# Patient Record
Sex: Female | Born: 1935 | Race: White | Hispanic: No | Smoking: Never smoker
Health system: Southern US, Community
[De-identification: ages and names within clinical notes are randomized; demographics above are authoritative.]

## PROBLEM LIST (undated history)

## (undated) DIAGNOSIS — F0390 Unspecified dementia without behavioral disturbance: Secondary | ICD-10-CM

## (undated) DIAGNOSIS — F329 Major depressive disorder, single episode, unspecified: Secondary | ICD-10-CM

## (undated) DIAGNOSIS — G4730 Sleep apnea, unspecified: Secondary | ICD-10-CM

## (undated) DIAGNOSIS — R41841 Cognitive communication deficit: Secondary | ICD-10-CM

## (undated) DIAGNOSIS — E785 Hyperlipidemia, unspecified: Secondary | ICD-10-CM

## (undated) DIAGNOSIS — N390 Urinary tract infection, site not specified: Secondary | ICD-10-CM

## (undated) DIAGNOSIS — D649 Anemia, unspecified: Secondary | ICD-10-CM

---

## 2006-10-21 ENCOUNTER — Encounter: Admission: RE | Admit: 2006-10-21 | Discharge: 2006-10-21 | Payer: Self-pay | Admitting: Hematology and Oncology

## 2012-03-13 ENCOUNTER — Encounter: Payer: Medicare Other | Admitting: Hematology and Oncology

## 2012-03-13 DIAGNOSIS — M810 Age-related osteoporosis without current pathological fracture: Secondary | ICD-10-CM

## 2012-03-13 DIAGNOSIS — C569 Malignant neoplasm of unspecified ovary: Secondary | ICD-10-CM

## 2020-08-04 ENCOUNTER — Encounter (HOSPITAL_COMMUNITY): Payer: Self-pay

## 2020-08-04 ENCOUNTER — Other Ambulatory Visit: Payer: Self-pay

## 2020-08-04 ENCOUNTER — Emergency Department (HOSPITAL_COMMUNITY): Payer: Medicare Other

## 2020-08-04 ENCOUNTER — Emergency Department (HOSPITAL_COMMUNITY)
Admission: EM | Admit: 2020-08-04 | Discharge: 2020-08-05 | Disposition: A | Discharge status: Home / Self Care | Payer: Medicare Other | Attending: Emergency Medicine | Admitting: Emergency Medicine

## 2020-08-04 DIAGNOSIS — R4182 Altered mental status, unspecified: Secondary | ICD-10-CM | POA: Diagnosis present

## 2020-08-04 DIAGNOSIS — F0150 Vascular dementia without behavioral disturbance: Secondary | ICD-10-CM

## 2020-08-04 DIAGNOSIS — N390 Urinary tract infection, site not specified: Secondary | ICD-10-CM | POA: Diagnosis not present

## 2020-08-04 DIAGNOSIS — F0151 Vascular dementia with behavioral disturbance: Secondary | ICD-10-CM | POA: Diagnosis not present

## 2020-08-04 DIAGNOSIS — N399 Disorder of urinary system, unspecified: Secondary | ICD-10-CM | POA: Insufficient documentation

## 2020-08-04 DIAGNOSIS — Z20822 Contact with and (suspected) exposure to covid-19: Secondary | ICD-10-CM | POA: Diagnosis not present

## 2020-08-04 DIAGNOSIS — F0390 Unspecified dementia without behavioral disturbance: Secondary | ICD-10-CM | POA: Diagnosis present

## 2020-08-04 HISTORY — DX: Urinary tract infection, site not specified: N39.0

## 2020-08-04 HISTORY — DX: Unspecified dementia, unspecified severity, without behavioral disturbance, psychotic disturbance, mood disturbance, and anxiety: F03.90

## 2020-08-04 LAB — RESPIRATORY PANEL BY RT PCR (FLU A&B, COVID)
Influenza A by PCR: NEGATIVE
Influenza B by PCR: NEGATIVE
SARS Coronavirus 2 by RT PCR: NEGATIVE

## 2020-08-04 LAB — RAPID URINE DRUG SCREEN, HOSP PERFORMED
Amphetamines: NOT DETECTED
Barbiturates: NOT DETECTED
Benzodiazepines: NOT DETECTED
Cocaine: NOT DETECTED
Opiates: NOT DETECTED
Tetrahydrocannabinol: NOT DETECTED

## 2020-08-04 LAB — URINALYSIS, ROUTINE W REFLEX MICROSCOPIC
Bilirubin Urine: NEGATIVE
Glucose, UA: NEGATIVE mg/dL
Ketones, ur: 5 mg/dL — AB
Leukocytes,Ua: NEGATIVE
Nitrite: NEGATIVE
Protein, ur: NEGATIVE mg/dL
Specific Gravity, Urine: 1.018 (ref 1.005–1.030)
pH: 5 (ref 5.0–8.0)

## 2020-08-04 LAB — CBC WITH DIFFERENTIAL/PLATELET
Abs Immature Granulocytes: 0.03 10*3/uL (ref 0.00–0.07)
Basophils Absolute: 0.1 10*3/uL (ref 0.0–0.1)
Basophils Relative: 1 %
Eosinophils Absolute: 0.1 10*3/uL (ref 0.0–0.5)
Eosinophils Relative: 2 %
HCT: 40.8 % (ref 36.0–46.0)
Hemoglobin: 12.9 g/dL (ref 12.0–15.0)
Immature Granulocytes: 0 %
Lymphocytes Relative: 11 %
Lymphs Abs: 0.9 10*3/uL (ref 0.7–4.0)
MCH: 28.4 pg (ref 26.0–34.0)
MCHC: 31.6 g/dL (ref 30.0–36.0)
MCV: 89.9 fL (ref 80.0–100.0)
Monocytes Absolute: 0.6 10*3/uL (ref 0.1–1.0)
Monocytes Relative: 8 %
Neutro Abs: 6.5 10*3/uL (ref 1.7–7.7)
Neutrophils Relative %: 78 %
Platelets: 237 10*3/uL (ref 150–400)
RBC: 4.54 MIL/uL (ref 3.87–5.11)
RDW: 13.8 % (ref 11.5–15.5)
WBC: 8.3 10*3/uL (ref 4.0–10.5)
nRBC: 0 % (ref 0.0–0.2)

## 2020-08-04 LAB — COMPREHENSIVE METABOLIC PANEL
ALT: 22 U/L (ref 0–44)
AST: 26 U/L (ref 15–41)
Albumin: 3.4 g/dL — ABNORMAL LOW (ref 3.5–5.0)
Alkaline Phosphatase: 51 U/L (ref 38–126)
Anion gap: 10 (ref 5–15)
BUN: 14 mg/dL (ref 8–23)
CO2: 27 mmol/L (ref 22–32)
Calcium: 8.8 mg/dL — ABNORMAL LOW (ref 8.9–10.3)
Chloride: 105 mmol/L (ref 98–111)
Creatinine, Ser: 0.88 mg/dL (ref 0.44–1.00)
GFR, Estimated: >60 mL/min (ref >60)
Glucose, Bld: 114 mg/dL — ABNORMAL HIGH (ref 70–99)
Potassium: 3.7 mmol/L (ref 3.5–5.1)
Sodium: 142 mmol/L (ref 135–145)
Total Bilirubin: 1 mg/dL (ref 0.3–1.2)
Total Protein: 7 g/dL (ref 6.5–8.1)

## 2020-08-04 LAB — ETHANOL: Alcohol, Ethyl (B): <10 mg/dL (ref <10)

## 2020-08-04 NOTE — ED Provider Notes (Signed)
[facility] EMERGENCY DEPARTMENT Provider Note   CSN: [id number] Arrival date & time: 08/04/20  1905     History Chief Complaint  Patient presents with  . IVC    DSS case    ORIE CUTTINO is a 85 y.o. female.  Patient has been IVCed by DSS.  Patient has recently been diagnosed with dementia she refuses to get out of bed or eat or drink anything she talks to people that are not there she has made statements that she would rather be dead  The history is provided by the patient and a caregiver. No language interpreter was used.  Altered Mental Status Presenting symptoms: behavior changes   Severity:  Moderate Most recent episode:  More than 2 days ago Episode history:  Multiple Timing:  Constant Progression:  Worsening Chronicity:  New Context: dementia   Associated symptoms: no abdominal pain        Past Medical History:  Diagnosis Date  . Dementia ([place])   . UTI (urinary tract infection)     There are no problems to display for this patient.   History reviewed. No pertinent surgical history.   OB History   No obstetric history on file.     No family history on file.  Social History   Tobacco Use  . Smoking status: Never Smoker  . Smokeless tobacco: Never Used  Substance Use Topics  . Alcohol use: Never  . Drug use: Never    Home Medications Prior to Admission medications   Not on File    Allergies    Patient has no known allergies.  Review of Systems   Review of Systems  Unable to perform ROS: Mental status change  Gastrointestinal: Negative for abdominal pain.    Physical Exam Updated Vital Signs BP (!) 110/97 (BP Location: Right Arm)   Pulse 86   Temp 98.1 F (36.7 C) (Oral)   Resp 17   Ht 5\' 2"  (1.575 m)   Wt 55.3 kg   SpO2 100%   BMI 22.31 kg/m   Physical Exam Vitals and nursing note reviewed.  Constitutional:      Appearance: She is well-developed.  HENT:     Head: Normocephalic.     Nose: Nose normal.  Eyes:      General: No scleral icterus.    Conjunctiva/sclera: Conjunctivae normal.  Neck:     Thyroid: No thyromegaly.  Cardiovascular:     Rate and Rhythm: Normal rate and regular rhythm.     Heart sounds: No murmur heard.  No friction rub. No gallop.   Pulmonary:     Breath sounds: No stridor. No wheezing or rales.  Chest:     Chest wall: No tenderness.  Abdominal:     General: There is no distension.     Tenderness: There is no abdominal tenderness. There is no rebound.  Musculoskeletal:        General: Normal range of motion.     Cervical back: Neck supple.  Lymphadenopathy:     Cervical: No cervical adenopathy.  Skin:    Findings: No erythema or rash.  Neurological:     Mental Status: She is alert.     Motor: No abnormal muscle tone.     Coordination: Coordination normal.     Comments: Patient is oriented to person and place but not date or situation  Psychiatric:     Comments: Patient denies suicidal ideation now.  According to DSS on the IVC papers she has been  suicidal and hallucinating     ED Results / Procedures / Treatments   Labs (all labs ordered are listed, but only abnormal results are displayed) Labs Reviewed - No data to display  EKG None  Radiology No results found.  Procedures Procedures (including critical care time)  Medications Ordered in ED Medications - No data to display  ED Course  I have reviewed the triage vital signs and the nursing notes.  Pertinent labs & imaging results that were available during my care of the patient were reviewed by me and considered in my medical decision making (see chart for details).    MDM Rules/Calculators/A&P                          Patient with dementia who lives by herself.  Patient is delusional and only oriented to person and place.  She is not capable of caring for herself    This patient presents to the ED for concern of altered mental status, this involves an extensive number of treatment options,  and is a complaint that carries with it a high risk of complications and morbidity.  The differential diagnosis includes dementia  Lab Tests:   I Ordered, reviewed, and interpreted labs, which included CBC chemistries which were unremarkable  Medicines ordered:     Imaging Studies ordered:   I ordered imaging studies which included CT head and chest x-ray  I independently visualized and interpreted imaging which showed unremarkable  Additional history obtained:   Additional history obtained from police officer  Previous records obtained and reviewed.  Consultations Obtained:   I consulted behavioral health and discussed lab and imaging findings  Reevaluation:  After the interventions stated above, I reevaluated the patient and found unchanged  Critical Interventions:  .   Final Clinical Impression(s) / ED Diagnoses Final diagnoses:  None    Rx / DC Orders ED Discharge Orders    None       Milton Ferguson, MD 08/05/20 1159

## 2020-08-04 NOTE — ED Triage Notes (Signed)
Pt brought in by [employer] with IVC papers. Officer Karlton Lemon reports talking DSS case worker, Tyron Russell, who reports she wanted pt to be seen here because [facility] let pt leave the other night, officer reports Colletta Maryland says DSS is the primary caregiver now. Pt is fall risk and has hx dementia and UTI. Pt is alert and oriented to self and knows that she is in hospital. Pt is very pleasant in triage. Pt has no complaints.

## 2020-08-05 DIAGNOSIS — F0151 Vascular dementia with behavioral disturbance: Secondary | ICD-10-CM | POA: Diagnosis not present

## 2020-08-05 DIAGNOSIS — F0390 Unspecified dementia without behavioral disturbance: Secondary | ICD-10-CM | POA: Diagnosis present

## 2020-08-05 DIAGNOSIS — N390 Urinary tract infection, site not specified: Secondary | ICD-10-CM

## 2020-08-05 NOTE — ED Notes (Signed)
In talking to pt, pt is very alert and oriented.  Speech clear.  Is able to answer questions appropriately.  Pt informed me that she cooks and cleans for her self.  Pt admits to having a Office manager with [employer] officer, [employer] (SW) however, says she do not agree with the things the SW says and that the SW do not know her or she does not know her that well.  Pt reports having UTI about one week ago and fell, well healing bruise noted to left eye.  Denies any pain or discomfort at this time.

## 2020-08-05 NOTE — ED Notes (Signed)
TTS complete 

## 2020-08-05 NOTE — Consult Note (Signed)
Telepsych Consultation   Reason for Consult:  Possible gero-psych consult Referring Physician:  EDP: Milton Ferguson, MD Location of Patient: [facility] [phone] Location of Provider: [facility]  Patient Identification: Leslie Mcknight MRN:  [id number] Principal Diagnosis: Dementia Leslie Mcknight) Diagnosis:  Principal Problem:   Dementia ([place]) Active Problems:   UTI (urinary tract infection)   Total Time spent with patient: 30 minutes  Subjective:   Leslie Mcknight is a 85 y.o. female patient admitted via IVC for concerns of worsening dementia and confusion with concerns for safety. On assessment patient presents alert and oriented to person, place "hospital", and situation "the girl came to my home and said they were concerned about me staying by myself I guess. She talked to me like I had no choice but to go so I had to come I guess". Patient verbalized knowledge of "not knowing exact dates" and having "memory issues when it comes to time" but stated "honey I'm retired after working in a [place] for [employer] where I never missed a day and had to keep up with time all day. When you retire like I am you take life as it comes and don't worry about time so much".  Pt is well groomed and ambulating independently in her room. Pt has large bruise surrounding left eye in which she states was 2/2 to a fall in her kitchen where she was able to get herself up. 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 Pt states she still drives "but only in the middle of the day when all the people that work are at work" and mows her own grass. Pt has one son who she says travels for work and checks on her when in town. Pt stated "I'm old, I don't get out of [place]". During assessment patient remained engaged throughout answering all questions appropriately and when unable to remember any events stated "my memory is bad, I don't remember". Patient denies any suicidal or homicidal  ideations; no paranoia or delusional thoughts verbalized, denies having any auditory or visual hallucinations, and was not observed responding to any external/internal stimuli. Patient is not visibly agitated or aggressive.  Per assigned EDRN Daphyne patient slept all night without any issues. Denies any signs or symptoms of sundowning or delirium noted. Denies observing any delusions or hallucinations. Pt has been ambulating to the bathroom with standby assistance. Pt has eaten all of meals and is engaging with staff appropriately.   Collateral: [place]: [facility] Adult Protective Services: [phone] Per current APS worker, pt was IVC and placed under emergency contact order after receiving an APS report for patient being acutely confused, allegedly refusing to eat, and concern for safety and ability to continue to live alone. Worker states patient has a medical history of cancer and was last seen at [facility] office for follow-up in 2009; states patient was refusing to go to doctor. Worker states patient was recently transported for medical care where it was determined she was positive for a urinary tract infection; unsure if patient completed antibiotic course. Worker states patient was hallucinating and delusional stating patient was talking to children that weren't there. Pt was just recently diagnosed with dementia since APS involvement.  Provider spent extensive time explaining patient's current presentation and how UTI's affect elderly patients and the likelihood of the infection possibly causing the acute mental status changes. Provider explained patient was currently denying any suicidal or homicidal  ideations, no delusions or hallucinations were present, and she was not actively responding to any external/internal stimuli. Worker states patient is unable to live alone and her concerns for patient upon discharge (ADLs, safety, etc); provider discussed the plan for social work to  follow-up to explore appropriate disposition options for the patient. Provider further explained that based on patient's current presentation, she does not meet inpatient hospitalization criteria at this time.   Holley Dexter. [place]: Patient's son: [phone] Provider attempted to call patient's son 3x's this a.m., was able to speak to him briefly before phone became disconnected. Per patient's son, "she was hallucinating and talking to people that weren't there where me and my cousin came to check on her a few weeks ago. There was money and papers scattered everywhere in the house. She normally keeps the house pretty immaculate but that wasn't the case. She wouldn't eat".    HPI:  Patient is an 85 year old female who was IVC'd by Adult Materials engineer out of concern for patient's inability to care for herself after recently experiencing altered mental status 2/2 UTI. APS worker reports patient was delusional and experiencing hallucinations. 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 Patient manages her own finances and ADLs. Recent dementia diagnosis. Patient has a son who lives close by but works out of town. Unsure of the onset and duration of UTI, however patient's son reports patient was grossly affected and concerned for her safety. Patient had recent fall in home where she was able to get herself up, currently has bruising around left eye. Patient currently shows no signs of psychosis or altered mental status at this time and denies any suicidal or homicidal ideations. Patient slept throughout the night and has completed her ADLs independently with minimal assistance.   Past Psychiatric History: none noted  Risk to Self: Suicidal Ideation: No Suicidal Intent: No Is patient at risk for suicide?: No Suicidal Plan?: No Access to Means: No What has been your use of drugs/alcohol within the last 12 months?: Denies How many times?: 0 Other  Self Harm Risks: None Triggers for Past Attempts: None known Intentional Self Injurious Behavior: None Risk to Others: Homicidal Ideation: No Thoughts of Harm to Others: No Current Homicidal Intent: No Current Homicidal Plan: No Access to Homicidal Means: No Identified Victim: No one History of harm to others?: No Assessment of Violence: None Noted Violent Behavior Description: None reported Does patient have access to weapons?: No Criminal Charges Pending?: No Does patient have a court date: No Prior Inpatient Therapy: Prior Inpatient Therapy: No Prior Outpatient Therapy: Prior Outpatient Therapy: No Does patient have an ACCT team?: No Does patient have Intensive In-House Services?  : No Does patient have [facility] services? : No Does patient have P4CC services?: No  Past Medical History:  Past Medical History:  Diagnosis Date  . Dementia ([place])   . UTI (urinary tract infection)    History reviewed. No pertinent surgical history. Family History: No family history on file. Family Psychiatric  History: not noted Social History:  Social History   Substance and Sexual Activity  Alcohol Use Never     Social History   Substance and Sexual Activity  Drug Use Never    Social History   Socioeconomic History  . Marital status: Widowed    Spouse name: Not on file  . Number of children: Not on file  . Years of education: Not on file  .  Highest education level: Not on file  Occupational History  . Not on file  Tobacco Use  . Smoking status: Never Smoker  . Smokeless tobacco: Never Used  Substance and Sexual Activity  . Alcohol use: Never  . Drug use: Never  . Sexual activity: Not on file  Other Topics Concern  . Not on file  Social History Narrative  . Not on file   Social Determinants of Health   Financial Resource Strain:   . Difficulty of Paying Living Expenses: Not on file  Food Insecurity:   . Worried About Charity fundraiser in the Last Year: Not on file    . Ran Out of Food in the Last Year: Not on file  Transportation Needs:   . Lack of Transportation (Medical): Not on file  . Lack of Transportation (Non-Medical): Not on file  Physical Activity:   . Days of Exercise per Week: Not on file  . Minutes of Exercise per Session: Not on file  Stress:   . Feeling of Stress : Not on file  Social Connections:   . Frequency of Communication with Friends and Family: Not on file  . Frequency of Social Gatherings with Friends and Family: Not on file  . Attends Religious Services: Not on file  . Active Member of Clubs or Organizations: Not on file  . Attends Archivist Meetings: Not on file  . Marital Status: Not on file   Additional Social History:    Allergies:  No Known Allergies  Labs:  Results for orders placed or performed during the hospital encounter of 08/04/20 (from the past 48 hour(s))  Respiratory Panel by RT PCR (Flu A&B, Covid) - Nasopharyngeal Swab     Status: None   Collection Time: 08/04/20  8:39 PM   Specimen: Nasopharyngeal Swab  Result Value Ref Range   SARS Coronavirus 2 by RT PCR NEGATIVE NEGATIVE    Comment: (NOTE) SARS-CoV-2 target nucleic acids are NOT DETECTED.  The SARS-CoV-2 RNA is generally detectable in upper respiratoy specimens during the acute phase of infection. The lowest concentration of SARS-CoV-2 viral copies this assay can detect is 131 copies/mL. A negative result does not preclude SARS-Cov-2 infection and should not be used as the sole basis for treatment or other patient management decisions. A negative result may occur with  improper specimen collection/handling, submission of specimen other than nasopharyngeal swab, presence of viral mutation(s) within the areas targeted by this assay, and inadequate number of viral copies (<131 copies/mL). A negative result must be combined with clinical observations, patient history, and epidemiological information. The expected result is  Negative.  Fact Sheet for Patients:  PinkCheek.be  Fact Sheet for Healthcare Providers:  GravelBags.it  This test is no t yet approved or cleared by the [place] FDA and  has been authorized for detection and/or diagnosis of SARS-CoV-2 by FDA under an Emergency Use Authorization (EUA). This EUA will remain  in effect (meaning this test can be used) for the duration of the COVID-19 declaration under Section 564(b)(1) of the Act, 21 U.S.C. section 360bbb-3(b)(1), unless the authorization is terminated or revoked sooner.     Influenza A by PCR NEGATIVE NEGATIVE   Influenza B by PCR NEGATIVE NEGATIVE    Comment: (NOTE) The Xpert Xpress SARS-CoV-2/FLU/RSV assay is intended as an aid in  the diagnosis of influenza from Nasopharyngeal swab specimens and  should not be used as a sole basis for treatment. Nasal washings and  aspirates are unacceptable for  Xpert Xpress SARS-CoV-2/FLU/RSV  testing.  Fact Sheet for Patients: PinkCheek.be  Fact Sheet for Healthcare Providers: GravelBags.it  This test is not yet approved or cleared by the [place] FDA and  has been authorized for detection and/or diagnosis of SARS-CoV-2 by  FDA under an Emergency Use Authorization (EUA). This EUA will remain  in effect (meaning this test can be used) for the duration of the  Covid-19 declaration under Section 564(b)(1) of the Act, 21  U.S.C. section 360bbb-3(b)(1), unless the authorization is  terminated or revoked. Performed at [facility], [address] [place] [address], [place], [place] [address]   CBC with Differential/Platelet     Status: None   Collection Time: 08/04/20  8:49 PM  Result Value Ref Range   WBC 8.3 4.0 - 10.5 K/uL   RBC 4.54 3.87 - 5.11 MIL/uL   Hemoglobin 12.9 12.0 - 15.0 g/dL   HCT 40.8 36 - 46 %   MCV 89.9 80.0 - 100.0 fL   MCH 28.4 26.0 - 34.0 pg   MCHC 31.6 30.0 -  36.0 g/dL   RDW 13.8 11.5 - 15.5 %   Platelets 237 150 - 400 K/uL   nRBC 0.0 0.0 - 0.2 %   Neutrophils Relative % 78 %   Neutro Abs 6.5 1.7 - 7.7 K/uL   Lymphocytes Relative 11 %   Lymphs Abs 0.9 0.7 - 4.0 K/uL   Monocytes Relative 8 %   Monocytes Absolute 0.6 0.1 - 1.0 K/uL   Eosinophils Relative 2 %   Eosinophils Absolute 0.1 0.0 - 0.5 K/uL   Basophils Relative 1 %   Basophils Absolute 0.1 0.0 - 0.1 K/uL   Immature Granulocytes 0 %   Abs Immature Granulocytes 0.03 0.00 - 0.07 K/uL    Comment: Performed at [facility], [address], [place], [place] [address]  Comprehensive metabolic panel     Status: Abnormal   Collection Time: 08/04/20  8:49 PM  Result Value Ref Range   Sodium 142 135 - 145 mmol/L   Potassium 3.7 3.5 - 5.1 mmol/L   Chloride 105 98 - 111 mmol/L   CO2 27 22 - 32 mmol/L   Glucose, Bld 114 (H) 70 - 99 mg/dL    Comment: Glucose reference range applies only to samples taken after fasting for at least 8 hours.   BUN 14 8 - 23 mg/dL   Creatinine, Ser 0.88 0.44 - 1.00 mg/dL   Calcium 8.8 (L) 8.9 - 10.3 mg/dL   Total Protein 7.0 6.5 - 8.1 g/dL   Albumin 3.4 (L) 3.5 - 5.0 g/dL   AST 26 15 - 41 U/L   ALT 22 0 - 44 U/L   Alkaline Phosphatase 51 38 - 126 U/L   Total Bilirubin 1.0 0.3 - 1.2 mg/dL   GFR, Estimated >60 >60 mL/min   Anion gap 10 5 - 15    Comment: Performed at [facility], [address] [place], [place] [address]  Ethanol     Status: None   Collection Time: 08/04/20  8:49 PM  Result Value Ref Range   Alcohol, Ethyl (B) <10 <10 mg/dL    Comment: (NOTE) Lowest detectable limit for serum alcohol is 10 mg/dL.  For medical purposes only. Performed at [facility], [address], [place], [place] [address]   Urinalysis, Routine w reflex microscopic Urine, Clean Catch     Status: Abnormal   Collection Time: 08/04/20  8:55 PM  Result Value Ref Range   Color, Urine YELLOW  YELLOW   APPearance CLEAR CLEAR   Specific Gravity, Urine 1.018 1.005 - 1.030    pH 5.0 5.0 - 8.0   Glucose, UA NEGATIVE NEGATIVE mg/dL   Hgb urine dipstick SMALL (A) NEGATIVE   Bilirubin Urine NEGATIVE NEGATIVE   Ketones, ur 5 (A) NEGATIVE mg/dL   Protein, ur NEGATIVE NEGATIVE mg/dL   Nitrite NEGATIVE NEGATIVE   Leukocytes,Ua NEGATIVE NEGATIVE   RBC / HPF 0-5 0 - 5 RBC/hpf   WBC, UA 11-20 0 - 5 WBC/hpf   Bacteria, UA RARE (A) NONE SEEN   Squamous Epithelial / LPF 0-5 0 - 5   Mucus PRESENT     Comment: Performed at [facility], [address] [place], [place] [address]  Rapid urine drug screen (hospital performed)     Status: None   Collection Time: 08/04/20  8:55 PM  Result Value Ref Range   Opiates NONE DETECTED NONE DETECTED   Cocaine NONE DETECTED NONE DETECTED   Benzodiazepines NONE DETECTED NONE DETECTED   Amphetamines NONE DETECTED NONE DETECTED   Tetrahydrocannabinol NONE DETECTED NONE DETECTED   Barbiturates NONE DETECTED NONE DETECTED    Comment: (NOTE) DRUG SCREEN FOR MEDICAL PURPOSES ONLY.  IF CONFIRMATION IS NEEDED FOR ANY PURPOSE, NOTIFY LAB WITHIN 5 DAYS.  LOWEST DETECTABLE LIMITS FOR URINE DRUG SCREEN Drug Class                     Cutoff (ng/mL) Amphetamine and metabolites    1000 Barbiturate and metabolites    200 Benzodiazepine                 774 Tricyclics and metabolites     300 Opiates and metabolites        300 Cocaine and metabolites        300 THC                            50 Performed at [facility], [address], [place], [place] [address]     Medications:  No current facility-administered medications for this encounter.   No current outpatient medications on file.    Musculoskeletal: Strength & Muscle Tone: within normal limits Gait & Station: normal Patient leans: N/A  Psychiatric Specialty Exam: Physical Exam Vitals and nursing note reviewed.  Constitutional:      Appearance: Normal appearance.  HENT:     Head: Normocephalic.  Skin:    Findings: Bruising present.       Neurological:     Mental  Status: She is alert.  Psychiatric:        Attention and Perception: Attention and perception normal.        Mood and Affect: Mood and affect normal.        Speech: Speech normal.        Behavior: Behavior normal. Behavior is cooperative.        Thought Content: Thought content normal.        Cognition and Memory: Memory is impaired.        Judgment: Judgment normal.     Comments: Patient alert and oriented to self, place "hospital", day of the week, and situation. Pt verbalized a full understanding of not knowing what month or year it was stating "when you're retired like I am, you don't really try to keep up with that kind of stuff you just do what needs to be done on a daily basis. When I worked  at the [employer] I had to keep up with getting to work on time so once I retired I said I would just take like as it comes".      Review of Systems  Blood pressure (!) 153/55, pulse (!) 53, temperature 98.1 F (36.7 C), temperature source Oral, resp. rate 18, height 5\' 2"  (1.575 m), weight 55.3 kg, SpO2 96 %.Body mass index is 22.31 kg/m.  General Appearance: Neat and Well Groomed  Eye Contact:  Good  Speech:  Normal Rate  Volume:  Normal  Mood:  Euthymic  Affect:  Appropriate and Congruent  Thought Process:  Coherent and Linear  Orientation:  Full (Time, Place, and Person)  Thought Content:  WDL and Logical  Suicidal Thoughts:  No  Homicidal Thoughts:  No  Memory:  Immediate;   Fair Recent;   Fair Remote;   Poor  Judgement:  Fair  Insight:  Fair  Psychomotor Activity:  Normal  Concentration:  Concentration: Good and Attention Span: Good  Recall:  [employer] of Knowledge:  Fair  Language:  Good  Akathisia:  No  Handed:  Right  AIMS (if indicated):     Assets:  Communication Skills Desire for Improvement Financial Resources/Insurance Housing Physical Health Resilience Social Support  ADL's:  Intact  Cognition:  Impaired,  Mild  Sleep:        Treatment Plan Summary: Daily  contact with patient to assess and evaluate symptoms and progress in treatment and Plan consult with Social Work and follow up based on disposition recommendations.  Patient has been cleared by the Psychiatry team.   Disposition: No evidence of imminent risk to self or others at present.   Patient does not meet criteria for psychiatric inpatient admission. Supportive therapy provided about ongoing stressors. Discussed crisis plan, support from social network, calling 911, coming to the Emergency Department, and calling Suicide Hotline. Patient does not qualify for inpatient psychiatric hospitalization at this time. Social Work consult placed.   This service was provided via telemedicine using a 2-way, interactive audio and video technology.  Names of all persons participating in this telemedicine service and their role in this encounter. Name: Oneida Alar Role: NP  Name: Hampton Abbot Role: Attending Physician  Name: [place] Role: [facility] Adult Protective Services  Name: Caroll Rancher Role: patient    Inda Merlin, NP 08/05/2020 9:55 AM

## 2020-08-05 NOTE — ED Provider Notes (Signed)
TTS consultation is appreciated.  Patient will be held overnight for reevaluation by psychiatry in the morning.   Leslie Fuel, MD 14/44/58 [phone]

## 2020-08-05 NOTE — BH Assessment (Signed)
Tele Assessment Note   Patient Name: Leslie Mcknight MRN: [id number] Referring Physician: Dr. Milton Ferguson Location of Patient: [facility] Location of Provider: [place]  Leslie Mcknight is an 85 y.o. female.  -Clinician reviewed note by Dr. Roderic Palau.  Patient has been IVCed by DSS.  Patient has recently been diagnosed with dementia she refuses to get out of bed or eat or drink anything she talks to people that are not there she has made statements that she would rather be dead.  Patient with dementia who lives by herself.  Patient is delusional and only oriented to person and place.  She is not capable of caring for herself.  Patient has been IVC'ed by Education officer, museum with [place] DSS.  IVC paperwork says that pt has dementia and refuses to get out of bed or eat.  Also that patient has been talking to people not present and has made statements about wanting to be dead.  Patient told this clinician [place] and how neighbors were concerned about her.  She said that she still cuts her own grass.  She said she does her ADLs and prepares her own food.  She does know who [employer]ley [employer] (DSS worker) is but has trouble recalling her name.  She says that she does not agree with Miss. [name] and feels that [name] "wants it her way or the highway."    Patient denies any SI, no plan, denies intention.  She says "you have to just deal with your problems, I think it is wrong" when talking about suicide.  Pt denies previous attempts.  Patient denies any HI.  When asked if she saw things she said she sometimes would see someone on her lawn but that was a few weeks ago.  Pt denies hearing voices.  Patient eye contact is good.  She did not know which hospital she was at.  She did not remember being at [facility] earlier in the week.  She talks about people (neighbors) coming and talking to her and how she tells them to talk with their parents if they have problems.   She repeated this about 4 tiems during interview.  Patient reports that she "goes to sleep when I feel sleepy" and that she gets up around 06:00 in the morning usually.  Patient reports "if I fix something to eat, I eat it."  Patient is not responding to internal stimuli.  Pt thought process appeared logical and coherent but repetitive.  Pt memory is poor.  She was only oriented to self.  She could not tell the date or which hospital she was at.  She did not have a clear grasp of the situation.    Pt says she has not had any past inpatient psychiatric care or outpt.  Clinician did call the number for the social worker but it was to the [place] office.  -Clinician discussed patient care with Lindon Romp, FNP who recommended overnight observation and review by psychiatry.  Clinician informed Dr. Roxanne Mins of the disposition.  Diagnosis: Dementia  Past Medical History:  Past Medical History:  Diagnosis Date  . Dementia ([place])   . UTI (urinary tract infection)     History reviewed. No pertinent surgical history.  Family History: No family history on file.  Social History:  reports that she has never smoked. She has never used smokeless tobacco. She reports that she does not drink alcohol and does not use drugs.  Additional Social  History:  Alcohol / Drug Use Pain Medications: See PTA medication list Prescriptions: See PTA medication list Over the Counter: See PTA medication list History of alcohol / drug use?: No history of alcohol / drug abuse  CIWA: CIWA-Ar BP: (!) 152/66 Pulse Rate: 61 COWS:    Allergies: No Known Allergies  Home Medications: (Not in a hospital admission)   OB/GYN Status:  No LMP recorded.  General Assessment Data [facility] of Assessment: [facility] TTS Assessment: In system Is this a Tele or Face-to-Face Assessment?: Tele Assessment Is this an Initial Assessment or a Re-assessment for this encounter?: Initial Assessment Patient Accompanied by::  N/A Language Other than English: No Living Arrangements: Other (Comment) (Pt lives by herself.) What gender do you identify as?: Female Date Telepsych consult ordered in [facility]: 08/04/20 Time Telepsych consult ordered in [facility]: 2240 Marital status: Divorced [place] name: Ray Pregnancy Status: No Living Arrangements: Alone Can pt return to current living arrangement?: Yes Admission Status: Involuntary Petitioner: Other (DSS worker) Is patient capable of signing voluntary admission?: No Referral Source: Other (DSS worker ) Insurance type: Medicare     Crisis Care Plan Living Arrangements: Alone Legal Guardian: Other: ([place]?) Name of Psychiatrist: None Name of Therapist: None  Education Status Is patient currently in school?: No Current Grade: N/A Highest grade of school patient has completed: N/A Name of school: N/A Contact person: N/A IEP information if applicable: N/A Is the patient employed, unemployed or receiving disability?: Unemployed  Risk to self with the past 6 months Suicidal Ideation: No Has patient been a risk to self within the past 6 months prior to admission? : No Suicidal Intent: No Has patient had any suicidal intent within the past 6 months prior to admission? : No Is patient at risk for suicide?: No Suicidal Plan?: No Has patient had any suicidal plan within the past 6 months prior to admission? : No Access to Means: No What has been your use of drugs/alcohol within the last 12 months?: Denies Previous Attempts/Gestures: No How many times?: 0 Other Self Harm Risks: None Triggers for Past Attempts: None known Intentional Self Injurious Behavior: None Family Suicide History: No Recent stressful life event(s): Turmoil (Comment) (Some stress with DSS worker [employer].) Persecutory voices/beliefs?: No Depression: Yes Depression Symptoms: Despondent Substance abuse history and/or treatment for substance abuse?: No Suicide  prevention information given to non-admitted patients: Not applicable  Risk to Others within the past 6 months Homicidal Ideation: No Does patient have any lifetime risk of violence toward others beyond the six months prior to admission? : No Thoughts of Harm to Others: No Current Homicidal Intent: No Current Homicidal Plan: No Access to Homicidal Means: No Identified Victim: No one History of harm to others?: No Assessment of Violence: None Noted Violent Behavior Description: None reported Does patient have access to weapons?: No Criminal Charges Pending?: No Does patient have a court date: No Is patient on probation?: No  Psychosis Hallucinations: Visual (3 weeks ago may have seen someone in her yard) Delusions: None noted  Mental Status Report Appearance/Hygiene: In hospital gown Eye Contact: Good Motor Activity: Freedom of movement Speech: Logical/coherent Level of Consciousness: Alert Mood: Pleasant Affect: Appropriate to circumstance Anxiety Level: Minimal Thought Processes: Coherent Judgement: Partial Orientation: Person Obsessive Compulsive Thoughts/Behaviors: None  Cognitive Functioning Concentration: Normal Memory: Recent Impaired, Remote Impaired Is patient IDD: No Insight: Fair Impulse Control: Good Appetite: Good Have you had any weight changes? : No Change Sleep: No Change Total Hours of Sleep:  (  Pt is unclear) Vegetative Symptoms: None  ADLScreening [facility] Assessment Services) Patient's cognitive ability adequate to safely complete daily activities?: Yes Patient able to express need for assistance with ADLs?: Yes Independently performs ADLs?: Yes (appropriate for developmental age)  Prior Inpatient Therapy Prior Inpatient Therapy: No  Prior Outpatient Therapy Prior Outpatient Therapy: No Does patient have an ACCT team?: No Does patient have Intensive In-House Services?  : No Does patient have [facility] services? : No Does patient have P4CC  services?: No  ADL Screening (condition at time of admission) Patient's cognitive ability adequate to safely complete daily activities?: Yes Is the patient deaf or have difficulty hearing?: No Does the patient have difficulty seeing, even when wearing glasses/contacts?: No Does the patient have difficulty concentrating, remembering, or making decisions?: Yes Patient able to express need for assistance with ADLs?: Yes Does the patient have difficulty dressing or bathing?: No Independently performs ADLs?: Yes (appropriate for developmental age) Does the patient have difficulty walking or climbing stairs?: No Weakness of Legs: None Weakness of Arms/Hands: None (Pt says she mows her own yard.)       Abuse/Neglect Assessment (Assessment to be complete while patient is alone) Abuse/Neglect Assessment Can Be Completed: Yes Physical Abuse: Denies Verbal Abuse: Denies Sexual Abuse: Denies Exploitation of patient/patient's resources: Denies Self-Neglect: Denies     Regulatory affairs officer (For Healthcare) Does Patient Have a Medical Advance Directive?: No Would patient like information on creating a medical advance directive?: No - Patient declined          Disposition:  Disposition Initial Assessment Completed for this Encounter: Yes Patient referred to: Other (Comment) (To be reviewed by psychiatry in AM)  This service was provided via telemedicine using a 2-way, interactive audio and video technology.  Names of all persons participating in this telemedicine service and their role in this encounter. Name: Hansika Leaming Role: patient  Name: Curlene Dolphin, M.S. LCAS QP Role: clinician  Name:  Role:   Name:  Role:     Raymondo Band 08/05/2020 2:08 AM

## 2020-08-05 NOTE — Clinical Social Work Note (Signed)
CSW has left voicemails on patient's DSS worker's, [employer], work and cell numbers requesting call back.

## 2020-08-05 NOTE — ED Notes (Signed)
Pt ambulatory to bathroom and back to room with standby assist - pt has slow steady gait.

## 2020-08-05 NOTE — Clinical Social Work Note (Addendum)
Transition of Care [facility]) - Emergency Department Mini Assessment  Patient Details  Name: Leslie Mcknight MRN: [id number] Date of Birth: 03-31-1935  [facility]) CM/SW Contact:    Sherie Don, LCSW Phone Number: 08/05/2020, 11:50 AM  Clinical Narrative: Patient is an 85 year old female who presented to the ED under IVC by patient's DSS case worker, Leslie Mcknight. CSW spoke with DSS supervisor, Leslie Mcknight. CSW reviewed labs with Leslie Mcknight. Per Leslie Mcknight, DSS is trying to coordinate the patient being discharged to the patient's son or daughter-in-law. DSS to notify CSW once a discharge plan is set up.  Addendum: 2:12pm-CSW received call from Leslie Mcknight. Per Ms. Leslie Mcknight, the patient can be picked up by her daughter-in-law, Leslie Mcknight, or her niece, Leslie Mcknight. CSW updated RN. TOC signing off.   ED Mini Assessment: What brought you to the Emergency Department? : IVC Barriers to Discharge: ED Psych evaluation, Other (comment) (Patient has active DSS case)  Barrier interventions: Spoke with APS to coordinate patient's discharge needs Means of departure: Car Interventions which prevented an admission or readmission: Other (must enter comment) (Coordinating with APS as they have custody of patient)  Patient Contact and Communications Key Contact 1: [place] with: Leslie Mcknight, Leslie Mcknight Contact Date: 08/05/20  Contact time: 1134 Contact Phone Number: [phone] Call outcome: DSS to coordinate her discharge Patient states their goals for this hospitalization and ongoing recovery are:: Discharge home CMS Medicare.gov Compare Post Acute Care list provided to:: Legal Guardian (DSS) Choice offered to / list presented to : [place] / Guardian  Admission diagnosis:  IVC    Patient Active Problem List   Diagnosis Date Noted  . Dementia ([place]) 08/05/2020  . UTI (urinary tract infection) 08/05/2020   PCP:  Merryl Hacker, No Pharmacy:    [facility] [place] [address] [place] [address] Phone: [phone] Fax: [phone]

## 2020-08-05 NOTE — ED Notes (Signed)
Pt able to fed her self without assistance.

## 2020-08-05 NOTE — Discharge Instructions (Signed)
Follow closely with your PCP in the coming week to discuss symptoms and ED presentation.

## 2020-08-05 NOTE — ED Notes (Signed)
Pt up to BR without assistance.  Gait steady.

## 2020-08-05 NOTE — ED Provider Notes (Signed)
Emergency Medicine Observation Re-evaluation Note  Leslie Mcknight is a 85 y.o. female, seen on rounds today.  Pt initially presented to the ED for complaints of IVC (DSS case) Currently, the patient is awaiting TTS evaluation.  Physical Exam  BP (!) 153/55   Pulse (!) 53   Temp 98.1 F (36.7 C) (Oral)   Resp 18   Ht 5\' 2"  (1.575 m)   Wt 55.3 kg   SpO2 96%   BMI 22.31 kg/m  Physical Exam General: Calm and cooperative.  Cardiac: Well perfused. Lungs: Even, unlabored respirations.   ED Course / MDM  EKG:    I have reviewed the labs performed to date as well as medications administered while in observation.  Recent changes in the last 24 hours include TTS evaluation and discharge home with family. IVC discontinued.  Plan    Patient cleared by TTS for d/c. Family is here to pick the patient up and take her home.   Patient is not under full IVC at this time.   Margette Fast, MD 08/08/20 1521

## 2020-08-06 LAB — URINE CULTURE: Culture: 30000 — AB

## 2020-08-10 ENCOUNTER — Encounter (HOSPITAL_COMMUNITY): Payer: Self-pay | Admitting: *Deleted

## 2020-08-10 ENCOUNTER — Emergency Department (HOSPITAL_COMMUNITY)
Admission: EM | Admit: 2020-08-10 | Discharge: 2020-08-15 | Disposition: A | Discharge status: Home / Self Care | Payer: Medicare Other | Attending: Emergency Medicine | Admitting: Emergency Medicine

## 2020-08-10 DIAGNOSIS — R2681 Unsteadiness on feet: Secondary | ICD-10-CM | POA: Insufficient documentation

## 2020-08-10 DIAGNOSIS — R45851 Suicidal ideations: Secondary | ICD-10-CM | POA: Insufficient documentation

## 2020-08-10 DIAGNOSIS — M6281 Muscle weakness (generalized): Secondary | ICD-10-CM | POA: Diagnosis not present

## 2020-08-10 DIAGNOSIS — F0390 Unspecified dementia without behavioral disturbance: Secondary | ICD-10-CM | POA: Insufficient documentation

## 2020-08-10 DIAGNOSIS — R440 Auditory hallucinations: Secondary | ICD-10-CM | POA: Insufficient documentation

## 2020-08-10 DIAGNOSIS — F0391 Unspecified dementia with behavioral disturbance: Secondary | ICD-10-CM

## 2020-08-10 DIAGNOSIS — Z79899 Other long term (current) drug therapy: Secondary | ICD-10-CM | POA: Insufficient documentation

## 2020-08-10 DIAGNOSIS — F329 Major depressive disorder, single episode, unspecified: Secondary | ICD-10-CM | POA: Diagnosis not present

## 2020-08-10 DIAGNOSIS — Z20822 Contact with and (suspected) exposure to covid-19: Secondary | ICD-10-CM | POA: Diagnosis not present

## 2020-08-10 DIAGNOSIS — Z046 Encounter for general psychiatric examination, requested by authority: Secondary | ICD-10-CM | POA: Diagnosis present

## 2020-08-10 DIAGNOSIS — F32A Depression, unspecified: Secondary | ICD-10-CM | POA: Diagnosis present

## 2020-08-10 DIAGNOSIS — R2689 Other abnormalities of gait and mobility: Secondary | ICD-10-CM | POA: Insufficient documentation

## 2020-08-10 DIAGNOSIS — N390 Urinary tract infection, site not specified: Secondary | ICD-10-CM | POA: Diagnosis not present

## 2020-08-10 LAB — CBC WITH DIFFERENTIAL/PLATELET
Abs Immature Granulocytes: 0.03 10*3/uL (ref 0.00–0.07)
Basophils Absolute: 0.1 10*3/uL (ref 0.0–0.1)
Basophils Relative: 1 %
Eosinophils Absolute: 0.1 10*3/uL (ref 0.0–0.5)
Eosinophils Relative: 1 %
HCT: 41.7 % (ref 36.0–46.0)
Hemoglobin: 13.3 g/dL (ref 12.0–15.0)
Immature Granulocytes: 1 %
Lymphocytes Relative: 16 %
Lymphs Abs: 0.9 10*3/uL (ref 0.7–4.0)
MCH: 29 pg (ref 26.0–34.0)
MCHC: 31.9 g/dL (ref 30.0–36.0)
MCV: 91 fL (ref 80.0–100.0)
Monocytes Absolute: 0.5 10*3/uL (ref 0.1–1.0)
Monocytes Relative: 9 %
Neutro Abs: 4.1 10*3/uL (ref 1.7–7.7)
Neutrophils Relative %: 72 %
Platelets: 273 10*3/uL (ref 150–400)
RBC: 4.58 MIL/uL (ref 3.87–5.11)
RDW: 13.8 % (ref 11.5–15.5)
WBC: 5.6 10*3/uL (ref 4.0–10.5)
nRBC: 0 % (ref 0.0–0.2)

## 2020-08-10 LAB — COMPREHENSIVE METABOLIC PANEL
ALT: 18 U/L (ref 0–44)
AST: 22 U/L (ref 15–41)
Albumin: 3.5 g/dL (ref 3.5–5.0)
Alkaline Phosphatase: 47 U/L (ref 38–126)
Anion gap: 16 — ABNORMAL HIGH (ref 5–15)
BUN: 27 mg/dL — ABNORMAL HIGH (ref 8–23)
CO2: 20 mmol/L — ABNORMAL LOW (ref 22–32)
Calcium: 9.2 mg/dL (ref 8.9–10.3)
Chloride: 99 mmol/L (ref 98–111)
Creatinine, Ser: 1.15 mg/dL — ABNORMAL HIGH (ref 0.44–1.00)
GFR, Estimated: 44 mL/min — ABNORMAL LOW (ref >60)
Glucose, Bld: 74 mg/dL (ref 70–99)
Potassium: 3.9 mmol/L (ref 3.5–5.1)
Sodium: 135 mmol/L (ref 135–145)
Total Bilirubin: 1.4 mg/dL — ABNORMAL HIGH (ref 0.3–1.2)
Total Protein: 7 g/dL (ref 6.5–8.1)

## 2020-08-10 LAB — RAPID URINE DRUG SCREEN, HOSP PERFORMED
Amphetamines: NOT DETECTED
Barbiturates: NOT DETECTED
Benzodiazepines: NOT DETECTED
Cocaine: NOT DETECTED
Opiates: NOT DETECTED
Tetrahydrocannabinol: NOT DETECTED

## 2020-08-10 LAB — URINALYSIS, ROUTINE W REFLEX MICROSCOPIC
Bacteria, UA: NONE SEEN
Bilirubin Urine: NEGATIVE
Glucose, UA: NEGATIVE mg/dL
Ketones, ur: 80 mg/dL — AB
Nitrite: NEGATIVE
Protein, ur: 30 mg/dL — AB
Specific Gravity, Urine: 1.019 (ref 1.005–1.030)
pH: 5 (ref 5.0–8.0)

## 2020-08-10 LAB — RESPIRATORY PANEL BY RT PCR (FLU A&B, COVID)
Influenza A by PCR: NEGATIVE
Influenza B by PCR: NEGATIVE
SARS Coronavirus 2 by RT PCR: NEGATIVE

## 2020-08-10 LAB — ETHANOL: Alcohol, Ethyl (B): <10 mg/dL (ref <10)

## 2020-08-10 MED ORDER — SODIUM CHLORIDE 0.9 % IV BOLUS
1000.0000 mL | Freq: Once | INTRAVENOUS | Status: AC
  Administered 2020-08-10: 1000 mL via INTRAVENOUS

## 2020-08-10 NOTE — ED Provider Notes (Signed)
[facility] EMERGENCY DEPARTMENT Provider Note   CSN: [id number] Arrival date & time: 08/10/20  1719     History Chief Complaint  Patient presents with  . IVC    Leslie Mcknight is a 85 y.o. female with PMHx dementia who presents to the ED today under IVC from [facility].   Per IVC paperwork: "[facility] ([facility]) has an open Adult Scientist, forensic case on Leslie Mcknight. An Emergency Protective Order [phone]) was obtained 07/22/2020 to seek medical evaluation and make all medical decisions for Leslie Mcknight due to suspected dementia/confusion. Leslie Mcknight granted the order, which is still in effect. [facility] has requested Leslie Mcknight's son and daughter in law provide 24 hour care to her in their home as Leslie Mcknight has been wandering and is a danger to herself. Leslie Mcknight, daughter in low to Leslie Mcknight reported today that Leslie Mcknight last ate a meal on 08/04/20 or 08/05/20. She ate a banana on 08/06/20 and has eaten nothing since then. Leslie Mcknight has refused all food and liquids, states that she wants to die. Leslie Mcknight states that she knows if she doesn't eat or drink, she will die and that is what she wants to happen."   Pt states that she denies SI, HI, or AVH. She reports that her family want her to do "this and that" and when they don't like what she does including mowing her yard or going for a walk they get mad at her and make her come here. She states she hasn't been very hungry lately but is willing to eat something here in the ED. She has no physical complaints today.   Additional information obtained by daughter in law who states that pt has been living with her since being discharged and she wont eat anything. She drank some water and coffee earlier today but otherwise the last time she ate was the day she was discharged. Daughter in law states that pt "wants to die" because she doesn't have her "freedom." She reports that the other night pt became combative with her  when she wouldn't let her out of the house around 11 PM and she also became angry when she wouldn't let patient walk to her house earlier today.   The history is provided by the patient and medical records.       Past Medical History:  Diagnosis Date  . Dementia ([place])   . UTI (urinary tract infection)     Patient Active Problem List   Diagnosis Date Noted  . Dementia ([place]) 08/05/2020  . UTI (urinary tract infection) 08/05/2020    History reviewed. No pertinent surgical history.   OB History   No obstetric history on file.     No family history on file.  Social History   Tobacco Use  . Smoking status: Never Smoker  . Smokeless tobacco: Never Used  Substance Use Topics  . Alcohol use: Never  . Drug use: Never    Home Medications Prior to Admission medications   Medication Sig Start Date End Date Taking? Authorizing Provider  nitrofurantoin (MACRODANTIN) 100 MG capsule Take 100 mg by mouth 2 (two) times daily. 07/22/20  Yes [provider]    Allergies    Patient has no known allergies.  Review of Systems   Review of Systems  Constitutional: Positive for appetite change.  Psychiatric/Behavioral: Negative for suicidal ideas.    Physical Exam Updated Vital Signs BP (!) 154/81 (BP Location: Right Arm)   Pulse 85  Temp 97.7 F (36.5 C) (Oral)   Resp 18   Wt 53.5 kg   SpO2 100%   BMI 21.58 kg/m   Physical Exam Vitals and nursing note reviewed.  Constitutional:      Appearance: She is not ill-appearing.  HENT:     Head: Normocephalic.     Comments: Old ecchymosis to left eye from previous fall Eyes:     Conjunctiva/sclera: Conjunctivae normal.  Cardiovascular:     Rate and Rhythm: Normal rate and regular rhythm.  Pulmonary:     Effort: Pulmonary effort is normal.     Breath sounds: Normal breath sounds. No wheezing, rhonchi or rales.  Abdominal:     Palpations: Abdomen is soft.     Tenderness: There is no abdominal tenderness. There is  no guarding or rebound.  Musculoskeletal:     Cervical back: Neck supple.  Skin:    General: Skin is warm and dry.  Neurological:     Mental Status: She is alert.     Comments: Oriented to person, place, and situation. Pt unable to recall year however when asked the month she states "I think it is 2 months from Christmas which would make it October."      ED Results / Procedures / Treatments   Labs (all labs ordered are listed, but only abnormal results are displayed) Labs Reviewed  COMPREHENSIVE METABOLIC PANEL - Abnormal; Notable for the following components:      Result Value   CO2 20 (*)    BUN 27 (*)    Creatinine, Ser 1.15 (*)    Total Bilirubin 1.4 (*)    GFR, Estimated 44 (*)    Anion gap 16 (*)    All other components within normal limits  URINALYSIS, ROUTINE W REFLEX MICROSCOPIC - Abnormal; Notable for the following components:   Hgb urine dipstick SMALL (*)    Ketones, ur 80 (*)    Protein, ur 30 (*)    Leukocytes,Ua TRACE (*)    All other components within normal limits  RESPIRATORY PANEL BY RT PCR (FLU A&B, COVID)  ETHANOL  RAPID URINE DRUG SCREEN, HOSP PERFORMED  CBC WITH DIFFERENTIAL/PLATELET    EKG None  Radiology No results found.  Procedures Procedures (including critical care time)  Medications Ordered in ED Medications  sodium chloride 0.9 % bolus 1,000 mL (1,000 mLs Intravenous New Bag/Given 08/10/20 1935)    ED Course  I have reviewed the triage vital signs and the nursing notes.  Pertinent labs & imaging results that were available during my care of the patient were reviewed by me and considered in my medical decision making (see chart for details).    MDM Rules/Calculators/A&P                          85 year old female presenting to the ED today under IVC placed by [employer] after family made them aware pt was not eating or drinking at home for the past 5 days and wishing to "die" per IVC paperwork. Pt denies any of this. She states she  has not been very hungry but has no SI, HI, or AVH. Incidentally pt was just discharged from the hospital on 10/15 after being IVC'd for similar issues. She did not meet inpatient criteria at that time and she was discharged home to live with daughter in law. It appears that pt has been trying to wander at nighttime and leave her house. Difficult to  say if she is getting appropriate care from family given her dementia and may benefit from SNF. Given she is under IVC first exam performed and labwork obtained. Pt has somewhat dry MM on exam; will evaluate dehydration with labs. Pt able to eat a fruit cup and drink coke in the ED after some persuasion. Will obtain social work consult as well as TTS.   CBC without leukocytosis. Hgb stable at 13.1.  CMP with creatinine 1.15 and BUN 27, bicarb 20, and gap of 16 consistent with dehydration. Fluids ordered.  U/A with trace leuks, 11-20 WBCs. Similar to a couple of days ago. Pt denies any urinary sx.  COVID negative. Remainder of labwork unremarkable. Pt is medically cleared at this time pending TTS eval.   Social work has requested PT eval. Have ordered.   At shift change case signed out to oncoming team who will dispo patient pending TTS eval.   This note was prepared using Dragon voice recognition software and may include unintentional dictation errors due to the inherent limitations of voice recognition software.  Final Clinical Impression(s) / ED Diagnoses Final diagnoses:  None    Rx / DC Orders ED Discharge Orders    None       Eustaquio Maize, PA-C 08/10/20 2101    Milton Ferguson, MD 08/12/20 0900

## 2020-08-10 NOTE — ED Triage Notes (Signed)
Brought in for evaluation by [facility]. Family states patient is not eating for the past few days

## 2020-08-10 NOTE — BH Assessment (Signed)
Comprehensive Clinical Assessment (CCA) Note  08/10/2020 Leslie Mcknight [id number]  Visit Diagnosis:    F32.9 Depressive Disorder    Leslie Mcknight is an 85 yo female currently in [place] ED after refusing to eat or drink.  Pt admits "I would rather be dead than to be bossed around like they are bossing me around".  Pt initially denied SI but then admits that she hasn't been eating so she would die. Pt denies any HI or AVH.  "People say that I hear voices or talk to myself, though".    Pt denies any past history of depression or anxiety.  Pt is currently living with daughter-in-law but reports that she wants to move back to her own home.  "I miss my freedoms".  Pt was poor historian and could not state her date of birth.  Pt had poor reflective capacity and impaired judgement.  Unable to obtain collateral information--called numbers on pts chart and could not leave voice mail message.  Patient's Currently Reported Symptoms/Problems: SI, depression  Jeanmarie Plant, MSW, LCSW Outpatient Therapist/Triage Specialist  Disposition: Per Lindon Romp, NP pt meets criteria for geropsychiatric inpatient treatment referral.  [facility]stine Na contacted with Dispo plan.     CCA Screening, Triage and Referral (STR)  Patient Reported Information How did you hear about [place]? Other (Comment)  Referral name: [facility]  Referral phone number: No data recorded  Whom do you see for routine medical problems? No data recorded Practice/Facility Name: No data recorded Practice/Facility Phone Number: No data recorded Name of Contact: No data recorded Contact Number: No data recorded Contact Fax Number: No data recorded Prescriber Name: No data recorded Prescriber Address (if known): No data recorded  What Is the Reason for Your Visit/Call Today? pt refusing to eat or drink because she wants to die  How Long Has This Been Causing You Problems? 1 wk - 1 month  What Do You Feel Would Help You the Most Today?  Assessment Only   Have You Recently Been in Any Inpatient Treatment (Hospital/Detox/Crisis Center/28-Day Program)? No  Name/Location of Program/Hospital:No data recorded How Long Were You There? No data recorded When Were You Discharged? No data recorded  Have You Ever Received Services From [facility] Before? Yes  Who Do You See at [facility]? No data recorded  Have You Recently Had Any Thoughts About Hurting Yourself? No  Are You Planning to Commit Suicide/Harm Yourself At This time? No   Have you Recently Had Thoughts About [place]? No  Explanation: No data recorded  Have You Used Any Alcohol or Drugs in the Past 24 Hours? No  How Long Ago Did You Use Drugs or Alcohol? No data recorded What Did You Use and How Much? No data recorded  Do You Currently Have a Therapist/Psychiatrist? No  Name of Therapist/Psychiatrist: No data recorded  Have You Been Recently Discharged From Any Office Practice or Programs? No data recorded Explanation of Discharge From Practice/Program: No data recorded    CCA Screening Triage Referral Assessment Type of Contact: Tele-Assessment  Is this Initial or Reassessment? Initial Assessment  Date Telepsych consult ordered in [facility]:  08/10/20  Time Telepsych consult ordered in [facility]:  [place]   Patient Reported Information Reviewed? Yes  Patient Left Without Being Seen? No data recorded Reason for Not Completing Assessment: No data recorded  Collateral Involvement: Attemped to reach [employer] listed on pt chart at [phone] call went straight to voicemail   Does Patient Have a Stage manager  Guardian? No data recorded Name and Contact of Legal Guardian: Billey [employer], [place] worker [phone]  If Minor and Not Living with Parent(s), Who has Custody? No data recorded Is CPS involved or ever been involved? No data recorded Is APS involved or ever been involved? Currently   Patient Determined To Be  At Risk for Harm To Self or Others Based on Review of Patient Reported Information or Presenting Complaint? Yes, for Self-Harm  Method: No data recorded Availability of Means: No data recorded Intent: No data recorded Notification Required: No data recorded Additional Information for Danger to Others Potential: No data recorded Additional Comments for Danger to Others Potential: No data recorded Are There Guns or Other Weapons in Your Home? No data recorded Types of Guns/Weapons: No data recorded Are These Weapons Safely Secured?                            No data recorded Who Could Verify You Are Able To Have These Secured: No data recorded Do You Have any Outstanding Charges, Pending Court Dates, Parole/Probation? No data recorded Contacted To Inform of Risk of Harm To Self or Others: No data recorded  Location of Assessment: [facility]   Does Patient Present under Involuntary Commitment? Yes  IVC Papers Initial File Date: No data recorded  [place] of Residence: [place]   Patient Currently Receiving the Following Services: No data recorded  Determination of Need: No data recorded  Options For Referral: Geropsychiatric Facility    CCA Biopsychosocial  Intake/Chief Complaint:  CCA Intake With Chief Complaint CCA Part Two Date: 08/10/20 CCA Part Two Time: 2215 Chief Complaint/Presenting Problem: Leslie Mcknight is an 85 yo female currently in [place] ED after refusing to eat or drink.  Pt admits "I would rather be dead than to be bossed around like they are bossing me around".  Pt initially denied SI but then admits that she hasn't been eating so she would die. Pt denies any HI or AVH.  "People say that I hear voices or talk to myself, though".  Pt denies any past history of depression or anxiety.  Pt is currently living with daughter-in-law but reports that she wants to move back to her own home.  "I miss my freedoms".  Pt was poor historian and could not state her date of birth.  Pt had  poor reflective capacity and impaired judgement. Patient's Currently Reported Symptoms/Problems: SI, depression Individual's Strengths: good family support Type of Services Patient Feels Are Needed: pt doesn't want services--wants to go home  Mental Health Symptoms Depression:  Depression: Change in energy/activity, Irritability, Increase/decrease in appetite  Mania:  Mania: N/A  Anxiety:   Anxiety: Worrying  Psychosis:  Psychosis: None (pt denies)  Trauma:  Trauma: None  Obsessions:  Obsessions: None  Compulsions:  Compulsions: None  Inattention:  Inattention: None  Hyperactivity/Impulsivity:  Hyperactivity/Impulsivity: N/A  Oppositional/Defiant Behaviors:  Oppositional/Defiant Behaviors: N/A  Emotional Irregularity:  Emotional Irregularity: None  Other Mood/Personality Symptoms:      Mental Status Exam Appearance and self-care  Stature:  Stature: Small  Weight:  Weight: Average weight  Clothing:  Clothing: Neat/clean  Grooming:  Grooming: Normal  Cosmetic use:  Cosmetic Use: None  Posture/gait:  Posture/Gait: Normal  Motor activity:  Motor Activity: Not Remarkable  Sensorium  Attention:  Attention: Confused  Concentration:  Concentration: Scattered  Orientation:  Orientation: Person, Situation, Place  Recall/memory:     Affect and Mood  Affect:  Affect: Labile  Mood:  Mood: Anxious  Relating  Eye contact:  Eye Contact: Normal  Facial expression:  Facial Expression: Tense  Attitude toward examiner:  Attitude Toward Examiner: Cooperative  Thought and Language  Speech flow: Speech Flow: Clear and Coherent  Thought content:  Thought Content: Appropriate to Mood and Circumstances  Preoccupation:  Preoccupations: None  Hallucinations:  Hallucinations: None  Organization:     Transport planner of Knowledge:  Fund of Knowledge: Good  Intelligence:  Intelligence: Average  Abstraction:  Abstraction: Normal  Judgement:  Judgement: Impaired  Reality Testing:  Reality  Testing: Distorted  Insight:  Insight: Gaps  Decision Making:  Decision Making: Impulsive  Social Functioning  Social Maturity:  Social Maturity: Impulsive  Social Judgement:  Social Judgement:  (unable to assess)  Stress  Stressors:  Stressors: Family conflict, Transitions  Coping Ability:  Coping Ability: Normal  Skill Deficits:  Skill Deficits: Activities of daily living, Self-control, Decision making, Communication, Responsibility, Self-care  Supports:  Supports: Family    Exercise/Diet: Exercise/Diet Do You Have Any Trouble Sleeping?: Yes Explanation of Sleeping Difficulties: up at night   CCA Employment/Education  Employment/Work Situation: Employment / Work Situation Employment situation: Retired Has patient ever been in the [employer]?: No   CCA Family/Childhood History  Family and Relationship History:    Childhood History:  Childhood History By whom was/is the patient raised?: Both parents Additional childhood history information: stable childhoos Description of patient's relationship with caregiver when they were a child: good relationships with parents when younger Did patient suffer any verbal/emotional/physical/sexual abuse as a child?: No Did patient suffer from severe childhood neglect?: No Has patient ever been sexually abused/assaulted/raped as an adolescent or adult?: No Was the patient ever a victim of a crime or a disaster?: No Witnessed domestic violence?: No Has patient been affected by domestic violence as an adult?: No  Child/Adolescent Assessment:     CCA Substance Use  Alcohol/Drug Use: Alcohol / Drug Use Pain Medications: See PTA medication list Prescriptions: See PTA medication list Over the Counter: See PTA medication list History of alcohol / drug use?: No history of alcohol / drug abuse     ASAM's:  Six Dimensions of Multidimensional Assessment  Dimension 1:  Acute Intoxication and/or Withdrawal Potential:   Dimension 1:   Description of individual's past and current experiences of substance use and withdrawal: pt denies  Dimension 2:  Biomedical Conditions and Complications:      Dimension 3:  Emotional, Behavioral, or Cognitive Conditions and Complications:     Dimension 4:  Readiness to Change:     Dimension 5:  Relapse, Continued use, or Continued Problem Potential:     Dimension 6:  Recovery/Living Environment:     ASAM Severity Score: ASAM's Severity Rating Score: 0  ASAM Recommended Level of Treatment:     Substance use Disorder (SUD)    Recommendations for Services/Supports/Treatments: Recommendations for Services/Supports/Treatments Recommendations For Services/Supports/Treatments: Inpatient Hospitalization  DSM5 Diagnoses: Patient Active Problem List   Diagnosis Date Noted  . Dementia ([place]) 08/05/2020  . UTI (urinary tract infection) 08/05/2020    Referrals to Alternative Service(s): Referred to Alternative Service(s):   Place:   Date:   Time:    Referred to Alternative Service(s):   Place:   Date:   Time:    Referred to Alternative Service(s):   Place:   Date:   Time:    Referred to Alternative Service(s):   Place:   Date:   Time:  [place]

## 2020-08-10 NOTE — Clinical Social Work Note (Signed)
CSW spoke with pts current DSS worker Carye [phone] ext [phone]. Per [facility] DSS has active Emergency Protective Order (EPO) for pt. Per Carye pt does not have capacity. Pt has been stating that she is not going to eat and that she wants to die. Pt is only to be picked up by her daughter-in-law, Aviyah Swetz, or her niece, Benita Stabile.

## 2020-08-10 NOTE — ED Notes (Signed)
Room was broke down things put away. Patient put on purple scrubs with no problems. Belongings were put in locker.

## 2020-08-11 DIAGNOSIS — F329 Major depressive disorder, single episode, unspecified: Secondary | ICD-10-CM | POA: Diagnosis not present

## 2020-08-11 NOTE — BH Assessment (Signed)
Reassessment note: Pt presents lying on her back in bed dressed in scrubs. Pt is alert. She reports she is feeling good but wants to go back home. Pt is concerned if she stays in the hospital bed she will lose the ability to walk. Pt states she is not sure why she is in the hospital. She shared that since not working or keeping calendars, she doesn't keep up with the days very well. Pt tenatively answered the year was 2021. She was unable to give the president's name but knew he was vice-president in the past. Pt says she likes to be outdoors & has work to do. She misses helping and talking with the people that live in one of [place] mobile home [place]. Pt states they are more like support to her than the people that have recently come around and got her in the hospital. Pt denies SI, HI and AVH. She spoke of overhearing people not being nice to each other at the mobile home park last night. Inpt tx continues to be recommended.

## 2020-08-11 NOTE — ED Provider Notes (Signed)
Emergency Medicine Observation Re-evaluation Note  Leslie Mcknight is a 85 y.o. female, seen on rounds today.  Pt initially presented to the ED for complaints of IVC Currently, the patient is awaiting placement.  Physical Exam  BP (!) 142/68   Pulse 80   Temp 98.2 F (36.8 C) (Oral)   Resp 18   Wt 53.5 kg   SpO2 100%   BMI 21.58 kg/m  Physical Exam General: No acute distress, resting Cardiac: Regular rate Lungs: Breathing easily, no retractions or stridor Psych: Calm, resting  ED Course / MDM  EKG:    I have reviewed the labs performed to date as well as medications administered while in observation.  Recent changes in the last 24 hours include no acute changes.  Urine culture ordered based on urinalysis abnormalities.  Urine culture still pending  Plan  Current plan is for inpatient gero psych.    Dorie Rank, MD 08/11/20 [phone]

## 2020-08-11 NOTE — Clinical Social Work Note (Signed)
CSW spoke with Jolene Schimke with DSS. CSW provided update that patient has been faxed out for inpatient placement and [facility] is awaiting bed offers. TOC to follow.

## 2020-08-11 NOTE — ED Notes (Signed)
Pt awake . Given paper and crayon to write a note per her request

## 2020-08-11 NOTE — Progress Notes (Signed)
Pt meets inpatient criteria per Lindon Romp, NP. Referral information has been sent to the following hospitals for review:  [facility]  [facility]     Disposition will continue to assist with inpatient placement needs.     Audree Camel, MSW, LCSW, [place] Clinical Social Worker II Disposition CSW [phone]

## 2020-08-11 NOTE — ED Notes (Signed)
Pt assisted to the restroom . Pt ambulatory

## 2020-08-11 NOTE — Evaluation (Signed)
Physical Therapy Evaluation Patient Details Name: Leslie Mcknight MRN: [id number] DOB: 12-30-1934 Today's Date: 08/11/2020   History of Present Illness  TIMOTHEA Mcknight is a 85 y.o. female with PMHx dementia who presents to the ED today under IVC from [facility].   Clinical Impression  Patient functioning at baseline for functional mobility and gait.  Plan:  Patient discharged from physical therapy to care of nursing for ambulation daily as tolerated for length of stay.     Follow Up Recommendations No PT follow up;Supervision - Intermittent    Equipment Recommendations  None recommended by PT    Recommendations for Other Services       Precautions / Restrictions Precautions Precautions: None Restrictions Weight Bearing Restrictions: No      Mobility  Bed Mobility Overal bed mobility: Modified Independent             General bed mobility comments: had to crawl back into bed secondary to gurney high for her sit on    Transfers Overall transfer level: Modified independent Equipment used: None             General transfer comment: no loss of balance  Ambulation/Gait Ambulation/Gait assistance: Modified independent (Device/Increase time) Gait Distance (Feet): 100 Feet Assistive device: None Gait Pattern/deviations: Decreased step length - right;Decreased step length - left;Decreased stride length Gait velocity: decreased   General Gait Details: slightly labored movement without loss of balance  Stairs            Wheelchair Mobility    Modified Rankin (Stroke Patients Only)       Balance Overall balance assessment: Mild deficits observed, not formally tested                                           Pertinent Vitals/Pain Pain Assessment: No/denies pain    Home Living Family/patient expects to be discharged to:: Private residence Living Arrangements: Alone Available Help at Discharge: Family;Available PRN/intermittently Type  of Home: Mobile home Home Access: Level entry     Home Layout: One level Home Equipment: None      Prior Function Level of Independence: Independent         Comments: household and short distanced community ambulator without AD     Hand Dominance        Extremity/Trunk Assessment   Upper Extremity Assessment Upper Extremity Assessment: Overall WFL for tasks assessed    Lower Extremity Assessment Lower Extremity Assessment: Overall WFL for tasks assessed    Cervical / Trunk Assessment Cervical / Trunk Assessment: Normal  Communication   Communication: No difficulties  Cognition Arousal/Alertness: Awake/alert Behavior During Therapy: WFL for tasks assessed/performed Overall Cognitive Status: Within Functional Limits for tasks assessed                                        General Comments      Exercises     Assessment/Plan    PT Assessment Patent does not need any further PT services  PT Problem List         PT Treatment Interventions      PT Goals (Current goals can be found in the Care Plan section)  Acute Rehab PT Goals Patient Stated Goal: return home with neighbors to assist PT Goal Formulation: With patient  Time For Goal Achievement: 08/11/20 Potential to Achieve Goals: Good    Frequency     Barriers to discharge        Co-evaluation               AM-PAC PT "6 Clicks" Mobility  Outcome Measure Help needed turning from your back to your side while in a flat bed without using bedrails?: None Help needed moving from lying on your back to sitting on the side of a flat bed without using bedrails?: None Help needed moving to and from a bed to a chair (including a wheelchair)?: None Help needed standing up from a chair using your arms (e.g., wheelchair or bedside chair)?: None Help needed to walk in hospital room?: A Little Help needed climbing 3-5 steps with a railing? : A Little 6 Click Score: 22    End of Session    Activity Tolerance: Patient tolerated treatment well Patient left: in bed;with call bell/phone within reach;Other (comment) (sitter observing patient) Nurse Communication: Mobility status PT Visit Diagnosis: Unsteadiness on feet (R26.81);Other abnormalities of gait and mobility (R26.89);Muscle weakness (generalized) (M62.81)    Time: 1624-4695 PT Time Calculation (min) (ACUTE ONLY): 18 min   Charges:   PT Evaluation $PT Eval Low Complexity: 1 Low PT Treatments $Therapeutic Activity: 8-22 mins        10:11 AM, 08/11/20 Lonell Grandchild, MPT Physical Therapist with [facility] [address] [phone] office [phone] mobile phone

## 2020-08-12 DIAGNOSIS — F329 Major depressive disorder, single episode, unspecified: Secondary | ICD-10-CM | POA: Diagnosis not present

## 2020-08-12 LAB — URINE CULTURE

## 2020-08-12 NOTE — ED Provider Notes (Signed)
Emergency Medicine Observation Re-evaluation Note  Leslie Mcknight is a 85 y.o. female, seen on rounds today.  Pt initially presented to the ED for complaints of IVC Currently, the patient is calm and appropriate.  Physical Exam  BP (!) 149/88 (BP Location: Left Arm)   Pulse 72   Temp 98.1 F (36.7 C) (Oral)   Resp 16   Wt 53.5 kg   SpO2 99%   BMI 21.58 kg/m  Physical Exam General: No distress Cardiac: Regular rate and rhythm Lungs: Clear to auscultation Psych: Appropriate  ED Course / MDM  EKG:EKG Interpretation  Date/Time:  Thursday August 11 2020 14:41:54 EDT Ventricular Rate:  84 PR Interval:  156 QRS Duration: 86 QT Interval:  376 QTC Calculation: 444 R Axis:   -44 Text Interpretation: Normal sinus rhythm Left axis deviation Anterior infarct , age undetermined Abnormal ECG No old tracing to compare Confirmed by Dorie Rank [phone]) on 08/11/2020 2:51:54 PM    I have reviewed the labs performed to date as well as medications administered while in observation.  Recent changes in the last 24 hours include no significant changes.  Plan  Current plan is for inpatient bed search. Patient is under full IVC at this time.   Leslie Rasmussen, MD 08/12/20 [phone]

## 2020-08-12 NOTE — ED Notes (Signed)
Pt up to bathroom and back to room with standby assist from [place], [place]

## 2020-08-12 NOTE — BH Assessment (Signed)
Re-Assessment 08/12/2020:  Clinician reviewed note by Dr. Roderic Palau.  Patient has been IVCed by DSS on 08/04/20.  "Patient has recently been diagnosed with dementia she refuses to get out of bed or eat or drink anything she talks to people that are not there she has made statements that she would rather be dead.  Patient with dementia who lives by herself.  Patient is delusional and only oriented to person and place.  She is not capable of caring for herself".  Today, patient states that she is having a great day and feels good. When asked what brought her to the Emergency Department on 08/04/20 she states that police made her come. She feels that "people" were calling the police and telling them things about her. Sts that she doesn't know exactly what was said but suspects it was her niece that made the calls. Sts that her niece is the reason why she is in the hospital. Patient becomes tearful. States, "She only see's me 4x's per year but seems know all my business". Denies current thoughts of suicide. Denies prior history of suicide attempts and/gestures. Denies depressive symptoms. States, "Anytime I started thinking of something bad I do something to keep me busy". When asked about her appetite she states, "I eat like a cow" and "I can eat anything". She reports sleeping well and getting plenty of rest. Patient denies HI.  Denies AVH's.  She does not appear to be responding to internal stimuli.  She is oriented to person and place. When asked about the year she states that it is 19. However, is aware that today is Friday. She is moderately aware of he situation leading to her IVC. She is alert and oriented.  Patient had good eye contact and was cooperative.  She was dressed in scrubs and he appeared appropriately groomed.  Patient's demeanor was tearful at times. She was observed sitting up in the hospital bed and appeared comfortable. Her mood and affect were depressed and sad.  Her speech was normal in  rate, rhythm, and volume.  Thought processes tangential with flight of ideas. There was no evidence of delusion.  Impulse control is fair. Judgment, and insight is poor.

## 2020-08-12 NOTE — Progress Notes (Signed)
Per Caryl Pina in admissions at [facility], pt is being reviewed. EKG, chest x-ray and Covid test results have been faxed to [facility] at their request.    Audree Camel, MSW, LCSW, [place] Worker II Disposition CSW [phone]

## 2020-08-12 NOTE — ED Notes (Signed)
Pt ambulated to restroom and assisted back into bed. Pt given warm blanket.
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
## 2020-08-13 DIAGNOSIS — F329 Major depressive disorder, single episode, unspecified: Secondary | ICD-10-CM | POA: Diagnosis not present

## 2020-08-13 MED ORDER — ZOLPIDEM TARTRATE 5 MG PO TABS
5.0000 mg | ORAL_TABLET | Freq: Every evening | ORAL | Status: DC | PRN
  Administered 2020-08-13 – 2020-08-14 (×3): 5 mg via ORAL
  Filled 2020-08-13 (×3): qty 1

## 2020-08-13 MED ORDER — NITROFURANTOIN MONOHYD MACRO 100 MG PO CAPS
100.0000 mg | ORAL_CAPSULE | Freq: Two times a day (BID) | ORAL | Status: DC
  Administered 2020-08-13 – 2020-08-15 (×5): 100 mg via ORAL
  Filled 2020-08-13 (×5): qty 1

## 2020-08-13 MED ORDER — HALOPERIDOL 5 MG PO TABS
2.5000 mg | ORAL_TABLET | Freq: Once | ORAL | Status: AC
  Administered 2020-08-13: 2.5 mg via ORAL
  Filled 2020-08-13: qty 1
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
## 2020-08-13 NOTE — Progress Notes (Signed)
CSW contacted [facility] admissions department and spoke with Caryl Pina to check status of this referral. She confirmed receipt of updated clinicals and is having their physician review. Caryl Pina stated that she would call back this afternoon with a decision.  Vaudie Engebretsen S. Ouida Sills, MSW, LCSW Clinical Social Worker 08/13/2020 10:18 AM

## 2020-08-13 NOTE — BH Assessment (Signed)
Patient was seen for re-assessment.  Patient was lucid at the time she was reassessed and was eating breakfast.  She was oriented and alert, attentive and cooperative.  However, at 1:58 this morning, she was delusional and talking to people who were not there.  Patient continues to be in need of geriatric psych placement.

## 2020-08-13 NOTE — ED Notes (Signed)
Pt repeatedly leaving room and trying to leave, was initially redirectable but is increasingly agitated and needing to be physically blocked from pushing through exit doors.

## 2020-08-14 DIAGNOSIS — F329 Major depressive disorder, single episode, unspecified: Secondary | ICD-10-CM | POA: Diagnosis not present

## 2020-08-14 NOTE — Progress Notes (Signed)
CSW contacted Caryl Pina from [facility] admissions to follow-up on this referral. Caryl Pina stated that pt has been denied due to her primary diagnosis being dementia. CSW re-faxed pt out to the following gero-psych facilities in an effort to secure placement:  [facility]  TTS continuing to assess.    Erubiel Manasco S. Ouida Sills, MSW, LCSW Clinical Social Worker 08/14/2020 8:09 AM

## 2020-08-14 NOTE — ED Notes (Signed)
Pt sitting up at bedside table folding linen

## 2020-08-14 NOTE — ED Notes (Signed)
Family visiting with patient

## 2020-08-14 NOTE — BH Assessment (Signed)
Patient was seen for reassessment.  She was disoriented to time, day, place and situation.  She was alert and pleasant as well as cooperative.  Patient states that she has been eating and sleeping well.  Patient denies SI/HI/Psychosis.  Patient's primary diagnosis is Dementia and she has been refused placement at [facility] units because of this diagnosis and the fact that other than behavior management, nothing will change with this patient.  There is no will be no benefit to inpatient psychiatric care and due to her disorientation, she would not be able to participate in the programs offered.  Patient is more appropriate for SNF placement.  Therefore, patient has been psych cleared to social work to pursue placement for her.  Per Letitia Libra, NP, patient has been psych cleared.

## 2020-08-14 NOTE — ED Notes (Signed)
Machine placed in room for repeat TTS

## 2020-08-15 DIAGNOSIS — F329 Major depressive disorder, single episode, unspecified: Secondary | ICD-10-CM | POA: Diagnosis not present

## 2020-08-15 NOTE — ED Notes (Signed)
Pt assisted to car for Leslie Mcknight to take her home.

## 2020-08-15 NOTE — Discharge Instructions (Signed)
Please follow up with your family doctor.

## 2020-08-15 NOTE — Clinical Social Work Note (Signed)
Transition of Care [facility]) - Emergency Department Mini Assessment  Patient Details  Name: Leslie Mcknight MRN: [id number] Date of Birth: 29-Mar-1935  [facility]) CM/SW Contact:    Leslie Don, LCSW Phone Number: 08/15/2020, 11:02 AM  Clinical Narrative: Patient is an 85 year old female who presented to the ED under IVC taken out by DSS. Per [facility], patient is psych cleared as her symptoms of dementia require behavior management rather than gero-psych placement.  CSW called [place] [facility] and updated Leslie Mcknight that patient has been psych cleared. DSS to update CSW with discharge plan.  CSW received return call from patient's DSS caseworker, Leslie Mcknight. Per Ms. Leslie Mcknight, patient will be picked up by her daughter-in-law, Leslie Mcknight [phone]). CSW called DIL and explained patient is psych cleared so the family can pick up the patient. DIL asked about placement. CSW explained patient will not be discharged to a psych facility and DSS will need to continue working with the family for LTC as DSS has not sorted out the patient's finances. RN given DIL contact information. TOC signing off.  ED Mini Assessment: What brought you to the Emergency Department? : IVC Barriers to Discharge: ED Psych evaluation, ED Barriers Resolved Barrier interventions: Coordinated with DSS and the family Means of departure: Car Interventions which prevented an admission or readmission: Other (must enter comment) (Follow up with DSS and the patient's family)  Patient Contact and Communications Key Contact 1: Leslie Mcknight with: Leslie Mcknight, Leslie Mcknight Contact Date: 08/15/20     Contact Phone Number: [phone] Call outcome: Family to pick up patient CMS Medicare.gov Compare Post Acute Care list provided to:: Legal Guardian Choice offered to / list presented to : [place] / Guardian  Admission diagnosis:  IVC  Patient Active Problem List   Diagnosis Date  Noted  . Depressive disorder   . Dementia ([place]) 08/05/2020  . UTI (urinary tract infection) 08/05/2020   PCP:  Leslie Mcknight, No Pharmacy:   [facility], [place] [address] [place] [address] Phone: [phone] Fax: [phone]

## 2020-08-15 NOTE — ED Provider Notes (Signed)
Patient no longer meets criteria for inpatient placement, the family is willing to come and pick her up and bring her home.  At this time the patient is medically stable for discharge in the care of her family   Leslie Chapel, MD 08/15/20 1232

## 2021-05-14 ENCOUNTER — Inpatient Hospital Stay (HOSPITAL_COMMUNITY)
Admission: EM | Admit: 2021-05-14 | Discharge: 2021-05-18 | DRG: 482 | Disposition: A | Discharge status: Skilled Nursing Facility | Payer: Medicare Other | Attending: Internal Medicine | Admitting: Internal Medicine

## 2021-05-14 ENCOUNTER — Inpatient Hospital Stay (HOSPITAL_COMMUNITY): Payer: Medicare Other

## 2021-05-14 ENCOUNTER — Encounter (HOSPITAL_COMMUNITY): Payer: Self-pay

## 2021-05-14 ENCOUNTER — Emergency Department (HOSPITAL_COMMUNITY): Payer: Medicare Other

## 2021-05-14 ENCOUNTER — Other Ambulatory Visit: Payer: Self-pay

## 2021-05-14 DIAGNOSIS — D72829 Elevated white blood cell count, unspecified: Secondary | ICD-10-CM | POA: Diagnosis present

## 2021-05-14 DIAGNOSIS — Z6821 Body mass index (BMI) 21.0-21.9, adult: Secondary | ICD-10-CM | POA: Diagnosis not present

## 2021-05-14 DIAGNOSIS — Z419 Encounter for procedure for purposes other than remedying health state, unspecified: Secondary | ICD-10-CM

## 2021-05-14 DIAGNOSIS — Y92009 Unspecified place in unspecified non-institutional (private) residence as the place of occurrence of the external cause: Secondary | ICD-10-CM | POA: Diagnosis not present

## 2021-05-14 DIAGNOSIS — R54 Age-related physical debility: Secondary | ICD-10-CM | POA: Diagnosis present

## 2021-05-14 DIAGNOSIS — S72142A Displaced intertrochanteric fracture of left femur, initial encounter for closed fracture: Secondary | ICD-10-CM | POA: Diagnosis present

## 2021-05-14 DIAGNOSIS — R739 Hyperglycemia, unspecified: Secondary | ICD-10-CM | POA: Diagnosis present

## 2021-05-14 DIAGNOSIS — S72002A Fracture of unspecified part of neck of left femur, initial encounter for closed fracture: Secondary | ICD-10-CM

## 2021-05-14 DIAGNOSIS — W010XXA Fall on same level from slipping, tripping and stumbling without subsequent striking against object, initial encounter: Secondary | ICD-10-CM | POA: Diagnosis present

## 2021-05-14 DIAGNOSIS — F32A Depression, unspecified: Secondary | ICD-10-CM | POA: Diagnosis present

## 2021-05-14 DIAGNOSIS — F0390 Unspecified dementia without behavioral disturbance: Secondary | ICD-10-CM | POA: Diagnosis present

## 2021-05-14 DIAGNOSIS — W19XXXA Unspecified fall, initial encounter: Secondary | ICD-10-CM | POA: Diagnosis not present

## 2021-05-14 DIAGNOSIS — S72142G Displaced intertrochanteric fracture of left femur, subsequent encounter for closed fracture with delayed healing: Secondary | ICD-10-CM | POA: Diagnosis not present

## 2021-05-14 DIAGNOSIS — M7989 Other specified soft tissue disorders: Secondary | ICD-10-CM | POA: Diagnosis present

## 2021-05-14 DIAGNOSIS — S72009A Fracture of unspecified part of neck of unspecified femur, initial encounter for closed fracture: Secondary | ICD-10-CM | POA: Diagnosis present

## 2021-05-14 DIAGNOSIS — Z20822 Contact with and (suspected) exposure to covid-19: Secondary | ICD-10-CM | POA: Diagnosis present

## 2021-05-14 DIAGNOSIS — R609 Edema, unspecified: Secondary | ICD-10-CM | POA: Diagnosis not present

## 2021-05-14 DIAGNOSIS — R600 Localized edema: Secondary | ICD-10-CM

## 2021-05-14 LAB — CBC WITH DIFFERENTIAL/PLATELET
Abs Immature Granulocytes: 0.07 10*3/uL (ref 0.00–0.07)
Basophils Absolute: 0.1 10*3/uL (ref 0.0–0.1)
Basophils Relative: 1 %
Eosinophils Absolute: 0 10*3/uL (ref 0.0–0.5)
Eosinophils Relative: 0 %
HCT: 44 % (ref 36.0–46.0)
Hemoglobin: 13.7 g/dL (ref 12.0–15.0)
Immature Granulocytes: 1 %
Lymphocytes Relative: 6 %
Lymphs Abs: 0.8 10*3/uL (ref 0.7–4.0)
MCH: 30.4 pg (ref 26.0–34.0)
MCHC: 31.1 g/dL (ref 30.0–36.0)
MCV: 97.6 fL (ref 80.0–100.0)
Monocytes Absolute: 0.7 10*3/uL (ref 0.1–1.0)
Monocytes Relative: 5 %
Neutro Abs: 11.9 10*3/uL — ABNORMAL HIGH (ref 1.7–7.7)
Neutrophils Relative %: 87 %
Platelets: 280 10*3/uL (ref 150–400)
RBC: 4.51 MIL/uL (ref 3.87–5.11)
RDW: 13.2 % (ref 11.5–15.5)
WBC: 13.6 10*3/uL — ABNORMAL HIGH (ref 4.0–10.5)
nRBC: 0 % (ref 0.0–0.2)

## 2021-05-14 LAB — URINALYSIS, ROUTINE W REFLEX MICROSCOPIC
Bacteria, UA: NONE SEEN
Bilirubin Urine: NEGATIVE
Glucose, UA: NEGATIVE mg/dL
Ketones, ur: 5 mg/dL — AB
Leukocytes,Ua: NEGATIVE
Nitrite: NEGATIVE
Protein, ur: NEGATIVE mg/dL
Specific Gravity, Urine: 1.008 (ref 1.005–1.030)
pH: 9 — ABNORMAL HIGH (ref 5.0–8.0)

## 2021-05-14 LAB — RESP PANEL BY RT-PCR (FLU A&B, COVID) ARPGX2
Influenza A by PCR: NEGATIVE
Influenza B by PCR: NEGATIVE
SARS Coronavirus 2 by RT PCR: NEGATIVE

## 2021-05-14 LAB — CK: Total CK: 202 U/L (ref 38–234)

## 2021-05-14 LAB — COMPREHENSIVE METABOLIC PANEL
ALT: 14 U/L (ref 0–44)
AST: 25 U/L (ref 15–41)
Albumin: 3.5 g/dL (ref 3.5–5.0)
Alkaline Phosphatase: 80 U/L (ref 38–126)
Anion gap: 7 (ref 5–15)
BUN: 12 mg/dL (ref 8–23)
CO2: 26 mmol/L (ref 22–32)
Calcium: 8.5 mg/dL — ABNORMAL LOW (ref 8.9–10.3)
Chloride: 106 mmol/L (ref 98–111)
Creatinine, Ser: 0.73 mg/dL (ref 0.44–1.00)
GFR, Estimated: >60 mL/min (ref >60)
Glucose, Bld: 140 mg/dL — ABNORMAL HIGH (ref 70–99)
Potassium: 4.3 mmol/L (ref 3.5–5.1)
Sodium: 139 mmol/L (ref 135–145)
Total Bilirubin: 1 mg/dL (ref 0.3–1.2)
Total Protein: 7.7 g/dL (ref 6.5–8.1)

## 2021-05-14 LAB — TYPE AND SCREEN
ABO/RH(D): B POS
Antibody Screen: NEGATIVE

## 2021-05-14 LAB — VITAMIN D 25 HYDROXY (VIT D DEFICIENCY, FRACTURES): Vit D, 25-Hydroxy: 48.67 ng/mL (ref 30–100)

## 2021-05-14 MED ORDER — SODIUM CHLORIDE 0.9 % IV BOLUS
1000.0000 mL | Freq: Once | INTRAVENOUS | Status: AC
  Administered 2021-05-14: 1000 mL via INTRAVENOUS

## 2021-05-14 MED ORDER — SODIUM CHLORIDE 0.9 % IV SOLN: INTRAVENOUS | Status: DC

## 2021-05-14 MED ORDER — FENTANYL CITRATE (PF) 100 MCG/2ML IJ SOLN: 50.0000 ug | INTRAMUSCULAR | Status: DC | PRN

## 2021-05-14 MED ORDER — FERROUS SULFATE 325 (65 FE) MG PO TABS: 325.0000 mg | ORAL_TABLET | Freq: Every day | ORAL | Status: DC

## 2021-05-14 MED ORDER — BISACODYL 5 MG PO TBEC: 5.0000 mg | DELAYED_RELEASE_TABLET | Freq: Every day | ORAL | Status: DC | PRN

## 2021-05-14 MED ORDER — MORPHINE SULFATE (PF) 2 MG/ML IV SOLN
0.5000 mg | INTRAVENOUS | Status: DC | PRN
  Administered 2021-05-14 – 2021-05-15 (×2): 0.5 mg via INTRAVENOUS
  Filled 2021-05-14 (×2): qty 1

## 2021-05-14 MED ORDER — HYDROCODONE-ACETAMINOPHEN 5-325 MG PO TABS: 1.0000 | ORAL_TABLET | Freq: Four times a day (QID) | ORAL | Status: DC | PRN

## 2021-05-14 MED ORDER — ONDANSETRON HCL 4 MG/2ML IJ SOLN
4.0000 mg | Freq: Once | INTRAMUSCULAR | Status: AC
  Administered 2021-05-14: 4 mg via INTRAVENOUS
  Filled 2021-05-14: qty 2

## 2021-05-14 MED ORDER — TRANEXAMIC ACID-NACL 1000-0.7 MG/100ML-% IV SOLN
1000.0000 mg | Freq: Once | INTRAVENOUS | Status: DC
  Filled 2021-05-14 (×2): qty 100

## 2021-05-14 NOTE — ED Notes (Signed)
Changed out urine canister almost full

## 2021-05-14 NOTE — ED Notes (Signed)
purewick still in place

## 2021-05-14 NOTE — Progress Notes (Signed)
Called by ED at [facility] for L IT femur fx. Recommend TRH admit at [facility] for surgical stabilization tomorrow. NPO after MN tonight. Hold chemical DVT ppx. Full consult to follow.

## 2021-05-14 NOTE — ED Provider Notes (Signed)
[facility] Provider Note   CSN: [id number] Arrival date & time: 05/14/21  0915     History Chief Complaint  Patient presents with   Fall   Hip Pain    LUTITIA RANCE is a 86 y.o. female.  HPI She presents for evaluation of injury to left hip.  She was found on floor this morning, by family members, inside her home.  It is suspected that she slept after walking outside in the rain.  It is unclear how long she was on the floor.  The patient is unable to give cogent history.  She presents by EMS, transferred, and suspected to have left hip fracture so treated with fentanyl.  Level 5 caveat-poor historian    Past Medical History:  Diagnosis Date   Dementia ([place])    UTI (urinary tract infection)     Patient Active Problem List   Diagnosis Date Noted   Hip fracture ([place]) 05/14/2021   Depressive disorder    Dementia ([place]) 08/05/2020   UTI (urinary tract infection) 08/05/2020    History reviewed. No pertinent surgical history.   OB History   No obstetric history on file.     No family history on file.  Social History   Tobacco Use   Smoking status: Never   Smokeless tobacco: Never  Substance Use Topics   Alcohol use: Never   Drug use: Never    Home Medications Prior to Admission medications   Medication Sig Start Date End Date Taking? Authorizing Provider  nitrofurantoin (MACRODANTIN) 100 MG capsule Take 100 mg by mouth 2 (two) times daily. 07/22/20   [provider]    Allergies    Patient has no known allergies.  Review of Systems   Review of Systems  Unable to perform ROS: Dementia   Physical Exam Updated Vital Signs BP (!) 161/85   Pulse 84   Temp (!) 97.4 F (36.3 C) (Oral)   Resp 19   Wt 52.2 kg   SpO2 96%   BMI 21.03 kg/m   Physical Exam Vitals and nursing note reviewed.  Constitutional:      General: She is not in acute distress.    Appearance: She is well-developed. She is not ill-appearing or  diaphoretic.  HENT:     Head: Normocephalic and atraumatic.     Right Ear: External ear normal.     Left Ear: External ear normal.     Mouth/Throat:     Mouth: Mucous membranes are moist.  Eyes:     Conjunctiva/sclera: Conjunctivae normal.     Pupils: Pupils are equal, round, and reactive to light.  Neck:     Trachea: Phonation normal.  Cardiovascular:     Rate and Rhythm: Normal rate.  Pulmonary:     Effort: Pulmonary effort is normal.  Chest:     Chest wall: No tenderness.  Abdominal:     General: There is no distension.     Palpations: Abdomen is soft.     Tenderness: There is no abdominal tenderness.  Musculoskeletal:        General: Normal range of motion.     Cervical back: Normal range of motion and neck supple.     Comments: Tender left hip with left leg shortening and external rotation.  Skin:    General: Skin is warm and dry.     Comments: Fecal and urine soiling on lower extremity  Neurological:     Mental Status: She is alert.  Cranial Nerves: No cranial nerve deficit.     Sensory: No sensory deficit.     Motor: No abnormal muscle tone.     Coordination: Coordination normal.  Psychiatric:        Mood and Affect: Mood normal.        Behavior: Behavior normal.    ED Results / Procedures / Treatments   Labs (all labs ordered are listed, but only abnormal results are displayed) Labs Reviewed  COMPREHENSIVE METABOLIC PANEL - Abnormal; Notable for the following components:      Result Value   Glucose, Bld 140 (*)    Calcium 8.5 (*)    All other components within normal limits  CBC WITH DIFFERENTIAL/PLATELET - Abnormal; Notable for the following components:   WBC 13.6 (*)    Neutro Abs 11.9 (*)    All other components within normal limits  URINALYSIS, ROUTINE W REFLEX MICROSCOPIC - Abnormal; Notable for the following components:   Color, Urine STRAW (*)    pH 9.0 (*)    Hgb urine dipstick SMALL (*)    Ketones, ur 5 (*)    All other components within  normal limits  RESP PANEL BY RT-PCR (FLU A&B, COVID) [id number]  CK  TYPE AND SCREEN    EKG EKG Interpretation  Date/Time:  Sunday May 14 2021 09:40:46 EDT Ventricular Rate:  83 PR Interval:    QRS Duration: 104 QT Interval:  386 QTC Calculation: 457 R Axis:   87 Text Interpretation: undetermined rhythm Borderline right axis deviation Low voltage, extremity leads Artifact in lead(s) I II III aVR aVL aVF V1 V2 V5 V6 since last tracing no significant change Confirmed by Daleen Bo [phone]) on 05/14/2021 12:36:12 PM  Radiology DG Hip Unilat With Pelvis 2-3 Views Left  Result Date: 05/14/2021 CLINICAL DATA:  Fall this morning with left hip pain with deformity noted EXAM: DG HIP (WITH OR WITHOUT PELVIS) 2-3V LEFT COMPARISON:  None. FINDINGS: Comminuted nondisplaced intertrochanteric proximal left femur fracture. No hip dislocation. No pelvic diastasis. Surgical clips throughout the right greater than left pelvis. Degenerative changes in the visualized lower lumbar spine. IMPRESSION: Comminuted nondisplaced intertrochanteric proximal left femur fracture. Electronically Signed   By: Ilona Sorrel M.D.   On: 05/14/2021 10:39    Procedures .Critical Care  Date/Time: 05/14/2021 12:46 PM Performed by: Daleen Bo, MD Authorized by: Daleen Bo, MD   Critical care provider statement:    Critical care time (minutes):  35   Critical care start time:  05/14/2021 9:25 AM   Critical care end time:  05/14/2021 12:47 PM   Critical care time was exclusive of:  Separately billable procedures and treating other patients   Critical care was necessary to treat or prevent imminent or life-threatening deterioration of the following conditions:  Trauma   Critical care was time spent personally by me on the following activities:  Blood draw for specimens, development of treatment plan with patient or surrogate, discussions with consultants, evaluation of patient's response to treatment, examination of  patient, obtaining history from patient or surrogate, ordering and performing treatments and interventions, ordering and review of laboratory studies, pulse oximetry, re-evaluation of patient's condition, review of old charts and ordering and review of radiographic studies   Medications Ordered in ED Medications  fentaNYL (SUBLIMAZE) injection 50 mcg (has no administration in time range)  sodium chloride 0.9 % bolus 1,000 mL (1,000 mLs Intravenous New Bag/Given 05/14/21 0957)    And  0.9 %  sodium chloride infusion ( Intravenous  New Bag/Given 05/14/21 0955)  ondansetron (ZOFRAN) injection 4 mg (4 mg Intravenous Given 05/14/21 0957)    ED Course  I have reviewed the triage vital signs and the nursing notes.  Pertinent labs & imaging results that were available during my care of the patient were reviewed by me and considered in my medical decision making (see chart for details).    MDM Rules/Calculators/A&P                            Patient Vitals for the past 24 hrs:  BP Temp Temp src Pulse Resp SpO2 Weight  05/14/21 1230 (!) 161/85 -- -- 84 19 96 % --  05/14/21 1200 133/61 -- -- 78 19 98 % --  05/14/21 1154 139/74 -- -- 85 20 98 % --  05/14/21 1109 -- -- -- 82 20 99 % --  05/14/21 1100 (!) 146/76 -- -- 85 17 98 % --  05/14/21 1030 (!) 147/82 -- -- 84 19 96 % --  05/14/21 1000 133/81 -- -- 94 -- -- --  05/14/21 0925 -- -- -- -- -- -- 52.2 kg  05/14/21 0924 (!) 126/99 (!) 97.4 F (36.3 C) Oral 75 19 98 % --    12:46 PM Reevaluation with update and discussion. After initial assessment and treatment, an updated evaluation reveals no change in clinical status, findings discussed with the patient. I left a message with Benita Stabile, her niece, who is her patient contact. Daleen Bo   Medical Decision Making:  This patient is presenting for evaluation of suspected left hip injury/fracture from fall, which does require a range of treatment options, and is a complaint that involves a  high risk of morbidity and mortality. The differential diagnoses include contusion, fracture, rhabdomyolysis, acute illness. I decided to review old records, and in summary elderly female, apparently unwitnessed fall at home with prolonged downtime, requiring ED evaluation after transfer by EMS.  She has a fairly benign past medical history with only dementia and UTI listed.  I obtained additional historical information from patient's niece who is her patient's niece who is her healthcare guardian.  Clinical Laboratory Tests Ordered, included CBC, Metabolic panel, and CK, blood type, urinalysis, viral panel . Review indicates normal except mild white cell elevation, glucose high, calcium low, urinalysis abnormal. Radiologic Tests Ordered, included pelvis and left hip x-ray.  I independently Visualized: Radiographs images, which show intertrochanteric fracture left, with angulation  Critical Interventions-clinical evaluation, laboratory testing, medication treatment, IV fluids, radiography, observation and reassessment  After These Interventions, the Patient was reevaluated and was found with hip fracture requiring operative management for pain control.  No complication found on screening labs and testing.  CRITICAL CARE-yes Performed by: Daleen Bo  Nursing Notes Reviewed/ Care Coordinated Applicable Imaging Reviewed Interpretation of Laboratory Data incorporated into ED treatment  11:50 AM-requested orthopedic callback for consultation/operative management.  Case discussed with Dr. Lyla Glassing, who request patient be transferred to [facility] for orthopedic management.  12:12 PM-Consult complete with hospitalist. Patient case explained and discussed.  He agrees to admit patient for further evaluation and treatment. Call ended at 12:40 PM    Final Clinical Impression(s) / ED Diagnoses Final diagnoses:  Closed fracture of left hip, initial encounter [facility])  Fall, initial encounter     Rx / DC Orders ED Discharge Orders     None        Daleen Bo, MD 05/14/21 1249

## 2021-05-14 NOTE — ED Triage Notes (Addendum)
Pt arrives via EMS from home. Patient states she slipped and fell last night. C/O left hip pain. Shortening and rotation noted of left leg. CMS intact. Pt denies hitting her head. EMS administered 15mg of fentanyl in route. EMS reports family found patient in the floor this morning.  Pt is alert and oriented to person, place, and situation.

## 2021-05-14 NOTE — H&P (Signed)
History and Physical  [facility]  STEFFI BARSNESS [id number] DOB: [birth year] DOA: 05/14/2021  PCP: Pcp, No  Patient coming from: home by EMS  Level of care: Med-Surg  I have personally briefly reviewed patient's old medical records in [place]  Chief Complaint: fall at home  HPI: Leslie Mcknight is a 86 y.o. female with medical history significant for dementia slipped at home at fell down last night and has been unable to get up since the fall. She complains of left hip pain.  She says she did not hit head. She c/o left knee pain. She denies fever and chills. She denies chest pain.  She was found by family members in the home on the floor.    ED Course: xray of hip with finding of comminuted nondisplaced intertrochanteric proximal left femur fracture.  WBC was 13.6, glucose 140, calcium 8.5, hemoglobin 13.7, platelet count was 280.  Orthopedics was consulted and Dr. Lyla Glassing requested that patient admit to [facility] and plan on operative repair on 7/25.  NPO after midnight.    Review of Systems: Review of Systems  Constitutional: Negative.  Negative for diaphoresis, fever and malaise/fatigue.  HENT:  Negative for hearing loss.   Eyes:  Negative for blurred vision.  Respiratory:  Negative for cough, hemoptysis, sputum production, shortness of breath and wheezing.   Cardiovascular:  Positive for leg swelling.  Gastrointestinal: Negative.   Genitourinary: Negative.   Musculoskeletal:  Positive for falls, joint pain and myalgias.  Skin:  Negative for itching and rash.  Neurological:  Positive for weakness.  Endo/Heme/Allergies: Negative.   Psychiatric/Behavioral:  Positive for memory loss.   All other systems reviewed and are negative.   Past Medical History:  Diagnosis Date   Dementia ([place])    UTI (urinary tract infection)     History reviewed. No pertinent surgical history.   reports that she has never smoked. She has never used smokeless tobacco. She reports that she  does not drink alcohol and does not use drugs.  No Known Allergies  No family history on file.  Prior to Admission medications   Medication Sig Start Date End Date Taking? Authorizing Provider  nitrofurantoin (MACRODANTIN) 100 MG capsule Take 100 mg by mouth 2 (two) times daily. 07/22/20   [provider]    Physical Exam: Vitals:   05/14/21 1109 05/14/21 1154 05/14/21 1200 05/14/21 1230  BP:  139/74 133/61 (!) 161/85  Pulse: 82 85 78 84  Resp: '20 20 19 19  '$ Temp:      TempSrc:      SpO2: 99% 98% 98% 96%  Weight:        Constitutional: frail, elderly female, awake, alert, lying supine in bed, NAD, calm, comfortable Eyes: PERRL, lids and conjunctivae normal ENMT: Mucous membranes are moist. Posterior pharynx clear of any exudate or lesions.Normal dentition.  Neck: normal, supple, no masses, no thyromegaly Respiratory: clear to auscultation bilaterally, no wheezing, no crackles. Normal respiratory effort. No accessory muscle use.  Cardiovascular: normal s1, s2 sounds, no murmurs / rubs / gallops. No extremity edema. 2+ pedal pulses. No carotid bruits.  Abdomen: no tenderness, no masses palpated. No hepatosplenomegaly. Bowel sounds positive.  Musculoskeletal: no clubbing / cyanosis. 2+ edema bilateral LEs. Left leg externally rotated. Good ROM, no contractures. Normal muscle tone.  Skin: no rashes, lesions, ulcers. No induration Neurologic: CN 2-12 grossly intact. Sensation intact, DTR normal. Strength 5/5 in all 4.  Psychiatric:  Alert and oriented to person and place.  Normal mood.   Labs on Admission: I have personally reviewed following labs and imaging studies  CBC: Recent Labs  Lab 05/14/21 0944  WBC 13.6*  NEUTROABS 11.9*  HGB 13.7  HCT 44.0  MCV 97.6  PLT 123456   Basic Metabolic Panel: Recent Labs  Lab 05/14/21 0944  NA 139  K 4.3  CL 106  CO2 26  GLUCOSE 140*  BUN 12  CREATININE 0.73  CALCIUM 8.5*   GFR: CrCl cannot be calculated (Unknown ideal  weight.). Liver Function Tests: Recent Labs  Lab 05/14/21 0944  AST 25  ALT 14  ALKPHOS 80  BILITOT 1.0  PROT 7.7  ALBUMIN 3.5   No results for input(s): LIPASE, AMYLASE in the last 168 hours. No results for input(s): AMMONIA in the last 168 hours. Coagulation Profile: No results for input(s): INR, PROTIME in the last 168 hours. Cardiac Enzymes: Recent Labs  Lab 05/14/21 0944  CKTOTAL 202   BNP (last 3 results) No results for input(s): PROBNP in the last 8760 hours. HbA1C: No results for input(s): HGBA1C in the last 72 hours. CBG: No results for input(s): GLUCAP in the last 168 hours. Lipid Profile: No results for input(s): CHOL, HDL, LDLCALC, TRIG, CHOLHDL, LDLDIRECT in the last 72 hours. Thyroid Function Tests: No results for input(s): TSH, T4TOTAL, FREET4, T3FREE, THYROIDAB in the last 72 hours. Anemia Panel: No results for input(s): VITAMINB12, FOLATE, FERRITIN, TIBC, IRON, RETICCTPCT in the last 72 hours. Urine analysis:    Component Value Date/Time   COLORURINE STRAW (A) 05/14/2021 0930   APPEARANCEUR CLEAR 05/14/2021 0930   LABSPEC 1.008 05/14/2021 0930   PHURINE 9.0 (H) 05/14/2021 0930   GLUCOSEU NEGATIVE 05/14/2021 0930   HGBUR SMALL (A) 05/14/2021 0930   BILIRUBINUR NEGATIVE 05/14/2021 0930   KETONESUR 5 (A) 05/14/2021 0930   PROTEINUR NEGATIVE 05/14/2021 0930   NITRITE NEGATIVE 05/14/2021 0930   LEUKOCYTESUR NEGATIVE 05/14/2021 0930    Radiological Exams on Admission: DG Hip Unilat With Pelvis 2-3 Views Left  Result Date: 05/14/2021 CLINICAL DATA:  Fall this morning with left hip pain with deformity noted EXAM: DG HIP (WITH OR WITHOUT PELVIS) 2-3V LEFT COMPARISON:  None. FINDINGS: Comminuted nondisplaced intertrochanteric proximal left femur fracture. No hip dislocation. No pelvic diastasis. Surgical clips throughout the right greater than left pelvis. Degenerative changes in the visualized lower lumbar spine. IMPRESSION: Comminuted nondisplaced  intertrochanteric proximal left femur fracture. Electronically Signed   By: Ilona Sorrel M.D.   On: 05/14/2021 10:39    Assessment/Plan Principal Problem:   Hip fracture [facility]) Active Problems:   Dementia ([place])   Fall at home   Hyperglycemia   Leukocytosis    Acute left hip fracture - Admit to [facility] for operative management per Dr. Sid Falcon request.  NPO after midnight.  Hold chemical DVT prophylaxis, tranexamic acid, check vitamin D, pain management ordered, IV fluid hydration, fall precautions.    Leukocytosis - reactive from hip fracture, repeat CBC in AM.    Fall at home - Plan for PT evaluation after hip repair completed.    Dementia - delirium precautions ordered.    Hyperglycemia - likely reactive from the stress of fracture and fall.  BMP in AM.   Bilateral leg swelling - left leg much more swollen than right, check venous US to rule out DVT.    DVT prophylaxis: TED [place]oses  Code Status: FULL   Family Communication: telephone call   Disposition Plan: anticipating SNF placement   Consults called: orthopedics  Admission status: INP  Level of care: Med-Surg Irwin Brakeman MD [facility] How to contact the [facility] Attending or Consulting provider [place] or covering provider during after hours [place], for this patient?  Check the care team in [facility] and look for a) attending/consulting [facility] provider listed and b) the [facility] team listed Log into [email or web address] and use [place]'s universal password to access. If you do not have the password, please contact the hospital operator. Locate the [facility] provider you are looking for under [facility] and page to a number that you can be directly reached. If you still have difficulty reaching the provider, please page the [facility] (Director on Call) for the Hospitalists listed on amion for assistance.   If 7PM-7AM, please contact night-coverage [email or web address] Password [email or web address]  05/14/2021, 1:06 PM

## 2021-05-15 ENCOUNTER — Inpatient Hospital Stay (HOSPITAL_COMMUNITY): Payer: Medicare Other

## 2021-05-15 ENCOUNTER — Encounter (HOSPITAL_COMMUNITY)
Admission: EM | Disposition: A | Discharge status: Skilled Nursing Facility | Payer: Self-pay | Source: Home / Self Care | Attending: Internal Medicine

## 2021-05-15 ENCOUNTER — Inpatient Hospital Stay (HOSPITAL_COMMUNITY): Payer: Medicare Other | Admitting: Anesthesiology

## 2021-05-15 DIAGNOSIS — S72142A Displaced intertrochanteric fracture of left femur, initial encounter for closed fracture: Principal | ICD-10-CM

## 2021-05-15 DIAGNOSIS — M7989 Other specified soft tissue disorders: Secondary | ICD-10-CM

## 2021-05-15 HISTORY — PX: INTRAMEDULLARY (IM) NAIL INTERTROCHANTERIC: SHX5875

## 2021-05-15 LAB — CBC
HCT: 35.4 % — ABNORMAL LOW (ref 36.0–46.0)
HCT: 35.5 % — ABNORMAL LOW (ref 36.0–46.0)
Hemoglobin: 11.2 g/dL — ABNORMAL LOW (ref 12.0–15.0)
Hemoglobin: 11.5 g/dL — ABNORMAL LOW (ref 12.0–15.0)
MCH: 30 pg (ref 26.0–34.0)
MCH: 30.7 pg (ref 26.0–34.0)
MCHC: 31.6 g/dL (ref 30.0–36.0)
MCHC: 32.4 g/dL (ref 30.0–36.0)
MCV: 94.9 fL (ref 80.0–100.0)
MCV: 94.7 fL (ref 80.0–100.0)
Platelets: 238 10*3/uL (ref 150–400)
Platelets: 226 10*3/uL (ref 150–400)
RBC: 3.73 MIL/uL — ABNORMAL LOW (ref 3.87–5.11)
RBC: 3.75 MIL/uL — ABNORMAL LOW (ref 3.87–5.11)
RDW: 13 % (ref 11.5–15.5)
RDW: 12.9 % (ref 11.5–15.5)
WBC: 8.9 10*3/uL (ref 4.0–10.5)
WBC: 15.6 10*3/uL — ABNORMAL HIGH (ref 4.0–10.5)
nRBC: 0 % (ref 0.0–0.2)
nRBC: 0 % (ref 0.0–0.2)

## 2021-05-15 LAB — BASIC METABOLIC PANEL
Anion gap: 7 (ref 5–15)
BUN: 7 mg/dL — ABNORMAL LOW (ref 8–23)
CO2: 22 mmol/L (ref 22–32)
Calcium: 7.8 mg/dL — ABNORMAL LOW (ref 8.9–10.3)
Chloride: 107 mmol/L (ref 98–111)
Creatinine, Ser: 0.82 mg/dL (ref 0.44–1.00)
GFR, Estimated: >60 mL/min (ref >60)
Glucose, Bld: 99 mg/dL (ref 70–99)
Potassium: 3.7 mmol/L (ref 3.5–5.1)
Sodium: 136 mmol/L (ref 135–145)

## 2021-05-15 LAB — CREATININE, SERUM
Creatinine, Ser: 0.82 mg/dL (ref 0.44–1.00)
GFR, Estimated: >60 mL/min (ref >60)

## 2021-05-15 LAB — TYPE AND SCREEN
ABO/RH(D): B POS
Antibody Screen: NEGATIVE

## 2021-05-15 LAB — SURGICAL PCR SCREEN
MRSA, PCR: NEGATIVE
Staphylococcus aureus: NEGATIVE

## 2021-05-15 SURGERY — FIXATION, FRACTURE, INTERTROCHANTERIC, WITH INTRAMEDULLARY ROD
Anesthesia: General | Laterality: Left

## 2021-05-15 MED ORDER — PHENYLEPHRINE 40 MCG/ML (10ML) SYRINGE FOR IV PUSH (FOR BLOOD PRESSURE SUPPORT)
PREFILLED_SYRINGE | INTRAVENOUS | Status: DC | PRN
  Administered 2021-05-15: 80 ug via INTRAVENOUS
  Administered 2021-05-15: 40 ug via INTRAVENOUS

## 2021-05-15 MED ORDER — METHOCARBAMOL 1000 MG/10ML IJ SOLN
500.0000 mg | Freq: Four times a day (QID) | INTRAVENOUS | Status: DC | PRN
  Filled 2021-05-15: qty 5

## 2021-05-15 MED ORDER — PHENOL 1.4 % MT LIQD: 1.0000 | OROMUCOSAL | Status: DC | PRN

## 2021-05-15 MED ORDER — METHOCARBAMOL 500 MG PO TABS: 500.0000 mg | ORAL_TABLET | Freq: Four times a day (QID) | ORAL | Status: DC | PRN

## 2021-05-15 MED ORDER — SUGAMMADEX SODIUM 200 MG/2ML IV SOLN
INTRAVENOUS | Status: DC | PRN
  Administered 2021-05-15: 200 mg via INTRAVENOUS

## 2021-05-15 MED ORDER — CEFAZOLIN SODIUM-DEXTROSE 2-4 GM/100ML-% IV SOLN
2.0000 g | Freq: Four times a day (QID) | INTRAVENOUS | Status: AC
Start: 2021-05-15 — End: 2021-05-16
  Administered 2021-05-15 – 2021-05-16 (×2): 2 g via INTRAVENOUS
  Filled 2021-05-15 (×2): qty 100

## 2021-05-15 MED ORDER — ACETAMINOPHEN 10 MG/ML IV SOLN: 1000.0000 mg | Freq: Once | INTRAVENOUS | Status: DC | PRN

## 2021-05-15 MED ORDER — TRANEXAMIC ACID-NACL 1000-0.7 MG/100ML-% IV SOLN
1000.0000 mg | Freq: Once | INTRAVENOUS | Status: AC
  Administered 2021-05-15: 1000 mg via INTRAVENOUS
  Filled 2021-05-15: qty 100

## 2021-05-15 MED ORDER — LACTATED RINGERS IV SOLN: INTRAVENOUS | Status: DC | PRN

## 2021-05-15 MED ORDER — TRANEXAMIC ACID-NACL 1000-0.7 MG/100ML-% IV SOLN
1000.0000 mg | INTRAVENOUS | Status: AC
  Administered 2021-05-15: 1000 mg via INTRAVENOUS

## 2021-05-15 MED ORDER — ENOXAPARIN SODIUM 40 MG/0.4ML IJ SOSY
40.0000 mg | PREFILLED_SYRINGE | INTRAMUSCULAR | Status: DC
  Administered 2021-05-16 – 2021-05-18 (×3): 40 mg via SUBCUTANEOUS
  Filled 2021-05-15 (×3): qty 0.4

## 2021-05-15 MED ORDER — POVIDONE-IODINE 10 % EX SWAB: 2.0000 "application " | Freq: Once | CUTANEOUS | Status: DC

## 2021-05-15 MED ORDER — DOCUSATE SODIUM 100 MG PO CAPS
100.0000 mg | ORAL_CAPSULE | Freq: Two times a day (BID) | ORAL | Status: DC
  Administered 2021-05-15 – 2021-05-18 (×7): 100 mg via ORAL
  Filled 2021-05-15 (×5): qty 1

## 2021-05-15 MED ORDER — FENTANYL CITRATE (PF) 250 MCG/5ML IJ SOLN
INTRAMUSCULAR | Status: DC | PRN
  Administered 2021-05-15: 100 ug via INTRAVENOUS
  Administered 2021-05-15 (×2): 50 ug via INTRAVENOUS

## 2021-05-15 MED ORDER — ONDANSETRON HCL 4 MG PO TABS: 4.0000 mg | ORAL_TABLET | Freq: Four times a day (QID) | ORAL | Status: DC | PRN

## 2021-05-15 MED ORDER — ONDANSETRON HCL 4 MG/2ML IJ SOLN
4.0000 mg | Freq: Once | INTRAMUSCULAR | Status: AC | PRN
  Administered 2021-05-15: 4 mg via INTRAVENOUS

## 2021-05-15 MED ORDER — FENTANYL CITRATE (PF) 250 MCG/5ML IJ SOLN
INTRAMUSCULAR | Status: AC
  Filled 2021-05-15: qty 5

## 2021-05-15 MED ORDER — ONDANSETRON HCL 4 MG/2ML IJ SOLN: 4.0000 mg | Freq: Four times a day (QID) | INTRAMUSCULAR | Status: DC | PRN

## 2021-05-15 MED ORDER — ONDANSETRON HCL 4 MG/2ML IJ SOLN
INTRAMUSCULAR | Status: AC
  Filled 2021-05-15: qty 2

## 2021-05-15 MED ORDER — LACTATED RINGERS IV SOLN: INTRAVENOUS | Status: DC

## 2021-05-15 MED ORDER — ENSURE ENLIVE PO LIQD: 237.0000 mL | Freq: Two times a day (BID) | ORAL | Status: DC

## 2021-05-15 MED ORDER — METOCLOPRAMIDE HCL 5 MG/ML IJ SOLN: 5.0000 mg | Freq: Three times a day (TID) | INTRAMUSCULAR | Status: DC | PRN

## 2021-05-15 MED ORDER — CHLORHEXIDINE GLUCONATE 4 % EX LIQD: 60.0000 mL | Freq: Once | CUTANEOUS | Status: DC

## 2021-05-15 MED ORDER — ACETAMINOPHEN 325 MG PO TABS: 325.0000 mg | ORAL_TABLET | Freq: Four times a day (QID) | ORAL | Status: DC | PRN

## 2021-05-15 MED ORDER — MORPHINE SULFATE (PF) 2 MG/ML IV SOLN: 0.5000 mg | INTRAVENOUS | Status: DC | PRN

## 2021-05-15 MED ORDER — HYDROCODONE-ACETAMINOPHEN 7.5-325 MG PO TABS: 1.0000 | ORAL_TABLET | ORAL | Status: DC | PRN

## 2021-05-15 MED ORDER — HYDROCODONE-ACETAMINOPHEN 5-325 MG PO TABS
1.0000 | ORAL_TABLET | ORAL | Status: DC | PRN
  Administered 2021-05-16 – 2021-05-18 (×2): 1 via ORAL
  Filled 2021-05-15 (×2): qty 1

## 2021-05-15 MED ORDER — CHLORHEXIDINE GLUCONATE 0.12 % MT SOLN
OROMUCOSAL | Status: AC
  Administered 2021-05-15: 15 mL
  Filled 2021-05-15: qty 15

## 2021-05-15 MED ORDER — ENSURE PRE-SURGERY PO LIQD
296.0000 mL | Freq: Once | ORAL | Status: DC
  Filled 2021-05-15: qty 296

## 2021-05-15 MED ORDER — MENTHOL 3 MG MT LOZG: 1.0000 | LOZENGE | OROMUCOSAL | Status: DC | PRN

## 2021-05-15 MED ORDER — PROPOFOL 10 MG/ML IV BOLUS
INTRAVENOUS | Status: DC | PRN
  Administered 2021-05-15: 20 mg via INTRAVENOUS
  Administered 2021-05-15: 80 mg via INTRAVENOUS

## 2021-05-15 MED ORDER — ONDANSETRON HCL 4 MG/2ML IJ SOLN
INTRAMUSCULAR | Status: DC | PRN
  Administered 2021-05-15: 4 mg via INTRAVENOUS

## 2021-05-15 MED ORDER — ADULT MULTIVITAMIN W/MINERALS CH: 1.0000 | ORAL_TABLET | Freq: Every day | ORAL | Status: DC

## 2021-05-15 MED ORDER — ROCURONIUM BROMIDE 10 MG/ML (PF) SYRINGE
PREFILLED_SYRINGE | INTRAVENOUS | Status: DC | PRN
  Administered 2021-05-15: 50 mg via INTRAVENOUS

## 2021-05-15 MED ORDER — MIDAZOLAM HCL 2 MG/2ML IJ SOLN
INTRAMUSCULAR | Status: AC
  Filled 2021-05-15: qty 2

## 2021-05-15 MED ORDER — LIDOCAINE 2% (20 MG/ML) 5 ML SYRINGE
INTRAMUSCULAR | Status: DC | PRN
  Administered 2021-05-15: 60 mg via INTRAVENOUS

## 2021-05-15 MED ORDER — CEFAZOLIN SODIUM-DEXTROSE 2-4 GM/100ML-% IV SOLN
2.0000 g | INTRAVENOUS | Status: AC
  Administered 2021-05-15: 2 g via INTRAVENOUS
  Filled 2021-05-15: qty 100

## 2021-05-15 MED ORDER — FENTANYL CITRATE (PF) 100 MCG/2ML IJ SOLN: 25.0000 ug | INTRAMUSCULAR | Status: DC | PRN

## 2021-05-15 MED ORDER — METOCLOPRAMIDE HCL 5 MG PO TABS: 5.0000 mg | ORAL_TABLET | Freq: Three times a day (TID) | ORAL | Status: DC | PRN

## 2021-05-15 SURGICAL SUPPLY — 58 items
ADH SKN CLS APL DERMABOND .7 (GAUZE/BANDAGES/DRESSINGS) ×2
ADH SKN CLS LQ APL DERMABOND (GAUZE/BANDAGES/DRESSINGS) ×2
ALCOHOL 70% 16 OZ (MISCELLANEOUS) ×2 IMPLANT
APL PRP STRL LF DISP 70% ISPRP (MISCELLANEOUS) ×1
BAG COUNTER SPONGE SURGICOUNT (BAG) ×2 IMPLANT
BAG SPNG CNTER NS LX DISP (BAG) ×1
BIT DRILL CANN LG 4.3MM (BIT) IMPLANT
BNDG COHESIVE 4X5 TAN STRL (GAUZE/BANDAGES/DRESSINGS) IMPLANT
CHLORAPREP W/TINT 26 (MISCELLANEOUS) ×2 IMPLANT
COVER PERINEAL POST (MISCELLANEOUS) ×2 IMPLANT
COVER SURGICAL LIGHT HANDLE (MISCELLANEOUS) ×2 IMPLANT
DERMABOND ADHESIVE PROPEN (GAUZE/BANDAGES/DRESSINGS) ×2
DERMABOND ADVANCED (GAUZE/BANDAGES/DRESSINGS) ×2
DERMABOND ADVANCED .7 DNX12 (GAUZE/BANDAGES/DRESSINGS) ×2 IMPLANT
DERMABOND ADVANCED .7 DNX6 (GAUZE/BANDAGES/DRESSINGS) IMPLANT
DRAPE C-ARM 42X72 X-RAY (DRAPES) ×2 IMPLANT
DRAPE C-ARMOR (DRAPES) ×2 IMPLANT
DRAPE HALF SHEET 40X57 (DRAPES) ×2 IMPLANT
DRAPE IMP U-DRAPE 54X76 (DRAPES) ×4 IMPLANT
DRAPE STERI IOBAN 125X83 (DRAPES) ×2 IMPLANT
DRAPE U-SHAPE 47X51 STRL (DRAPES) ×4 IMPLANT
DRILL BIT CANN LG 4.3MM (BIT) ×2
DRSG MEPILEX BORDER 4X4 (GAUZE/BANDAGES/DRESSINGS) ×4 IMPLANT
DRSG MEPITEL 3X4 ME34 (GAUZE/BANDAGES/DRESSINGS) ×3 IMPLANT
DRSG PAD ABDOMINAL 8X10 ST (GAUZE/BANDAGES/DRESSINGS) IMPLANT
ELECT REM PT RETURN 9FT ADLT (ELECTROSURGICAL) ×2
ELECTRODE REM PT RTRN 9FT ADLT (ELECTROSURGICAL) ×1 IMPLANT
FACESHIELD WRAPAROUND (MASK) ×2 IMPLANT
FACESHIELD WRAPAROUND OR TEAM (MASK) ×1 IMPLANT
GLOVE SURG ENC MOIS LTX SZ8.5 (GLOVE) ×4 IMPLANT
GLOVE SURG ENC TEXT LTX SZ7 (GLOVE) ×2 IMPLANT
GLOVE SURG UNDER POLY LF SZ7.5 (GLOVE) ×2 IMPLANT
GLOVE SURG UNDER POLY LF SZ8.5 (GLOVE) ×2 IMPLANT
GOWN STRL REUS W/ TWL LRG LVL3 (GOWN DISPOSABLE) ×2 IMPLANT
GOWN STRL REUS W/ TWL XL LVL3 (GOWN DISPOSABLE) ×1 IMPLANT
GOWN STRL REUS W/TWL 2XL LVL3 (GOWN DISPOSABLE) ×3 IMPLANT
GOWN STRL REUS W/TWL LRG LVL3 (GOWN DISPOSABLE) ×4
GOWN STRL REUS W/TWL XL LVL3 (GOWN DISPOSABLE) ×2
GUIDEPIN VERSANAIL DSP 3.2X444 (ORTHOPEDIC DISPOSABLE SUPPLIES) ×1 IMPLANT
HFN 125 DEG 9MM X 180MM (Nail) ×1 IMPLANT
HFN LAG SCREW 10.5MM X 85MM (Screw) ×1 IMPLANT
KIT TURNOVER KIT B (KITS) ×2 IMPLANT
MANIFOLD NEPTUNE II (INSTRUMENTS) ×2 IMPLANT
MARKER SKIN DUAL TIP RULER LAB (MISCELLANEOUS) ×2 IMPLANT
NS IRRIG 1000ML POUR BTL (IV SOLUTION) ×2 IMPLANT
PACK GENERAL/GYN (CUSTOM PROCEDURE TRAY) ×2 IMPLANT
PACK UNIVERSAL I (CUSTOM PROCEDURE TRAY) ×2 IMPLANT
PAD ARMBOARD 7.5X6 YLW CONV (MISCELLANEOUS) ×4 IMPLANT
PADDING CAST ABS 4INX4YD NS (CAST SUPPLIES)
PADDING CAST ABS COTTON 4X4 ST (CAST SUPPLIES) IMPLANT
SCREW BONE CORTICAL 5.0X32 (Screw) ×1 IMPLANT
STAPLER SKIN PROX 35W (STAPLE) ×1 IMPLANT
SUT MNCRL AB 3-0 PS2 27 (SUTURE) ×2 IMPLANT
SUT MON AB 2-0 CT1 27 (SUTURE) ×3 IMPLANT
SUT VIC AB 1 CT1 27 (SUTURE) ×2
SUT VIC AB 1 CT1 27XBRD ANBCTR (SUTURE) ×1 IMPLANT
TOWEL GREEN STERILE (TOWEL DISPOSABLE) ×2 IMPLANT
TOWEL GREEN STERILE FF (TOWEL DISPOSABLE) ×2 IMPLANT

## 2021-05-15 NOTE — Assessment & Plan Note (Addendum)
-   check A1c = 5.5% - likely reactive

## 2021-05-15 NOTE — Assessment & Plan Note (Addendum)
- "  Comminuted nondisplaced intertrochanteric proximal left femur Fracture." - s/p IM nail fixation 05/15/21 -PT/OT after surgery - DVT prophylaxis, Lovenox

## 2021-05-15 NOTE — Op Note (Signed)
OPERATIVE REPORT  SURGEON: Rod Can, MD   ASSISTANT: Staff.  PREOPERATIVE DIAGNOSIS: Left intertrochanteric femur fracture.   POSTOPERATIVE DIAGNOSIS: Left intertrochanteric femur fracture.   PROCEDURE: Intramedullary fixation, Left femur.   IMPLANTS: Biomet Affixus Hip Fracture Nail, 9 by 180 mm, 125 degrees. 10.5 x 85 mm Hip Fracture Nail Lag Screw. 5 x 32 mm distal interlocking screw 1.  ANESTHESIA:  General  ESTIMATED BLOOD LOSS:-100 mL    ANTIBIOTICS: 2 g Ancef.  DRAINS: None.  COMPLICATIONS: None.   CONDITION: PACU - hemodynamically stable.   BRIEF CLINICAL NOTE: Leslie Mcknight is a 86 y.o. female who presented with an intertrochanteric femur fracture. The patient was admitted to the hospitalist service and underwent perioperative risk stratification and medical optimization. The risks, benefits, and alternatives to the procedure were explained, and the patient elected to proceed.  PROCEDURE IN DETAIL: Surgical site was marked by myself. The patient was taken to the operating room and anesthesia was induced on the bed. The patient was then transferred to the [facility] table and the nonoperative lower extremity was scissored underneath the operative side. The fracture was reduced with traction, internal rotation, and adduction. The hip was prepped and draped in the normal sterile surgical fashion. Timeout was called verifying side and site of surgery. Preop antibiotics were given with 60 minutes of beginning the procedure.  Fluoroscopy was used to define the patient's anatomy. A 4 cm incision was made just proximal to the tip of the greater trochanter. The awl was used to obtain the standard starting point for a trochanteric entry nail under fluoroscopic control. The guidepin was placed. The entry reamer was used to open the proximal femur.  On the back table, the nail was assembled onto the jig. The nail was placed into the femur without any difficulty. Through a separate  stab incision, the cannula was placed down to the bone in preparation for the cephalomedullary device. A guidepin was placed into the femoral head using AP and lateral fluoroscopy views. The pin was measured, and then reaming was performed to the appropriate depth. The lag screw was inserted to the appropriate depth. The fracture was compressed through the jig. The setscrew was tightened and then loosened one quarter turn. A separate stab incision was created, and the distal interlocking screw was placed using standard AO technique. The jig was removed. Final AP and lateral fluoroscopy views were obtained to confirm fracture reduction and hardware placement. Tip apex distance was appropriate. There was no chondral penetration.  The wounds were copiously irrigated with saline. The wound was closed in layers with #1 Vicryl for the fascia, 2-0 Monocryl for the deep dermal layer, and skin staples. Glue was applied to the skin. Once the glue was fully hardened, sterile dressing was applied. The patient was then awakened from anesthesia and taken to the PACU in stable condition. Sponge needle and instrument counts were correct at the end of the case 2. There were no known complications.  We will readmit the patient to the hospitalist. Weightbearing status will be weightbearing as tolerated with a walker. We will begin Lovenox for DVT prophylaxis. The patient will work with physical therapy and undergo disposition planning.  Please note that a surgical assistant was a medical necessity for this procedure to perform it in a safe and expeditious manner. Assistant was necessary to provide appropriate retraction of vital neurovascular structures, to prevent femoral fracture, and to allow for anatomic placement of the prosthesis.

## 2021-05-15 NOTE — Assessment & Plan Note (Signed)
-   likely reactive s/p fall

## 2021-05-15 NOTE — Progress Notes (Signed)
Initial Nutrition Assessment  DOCUMENTATION CODES:   Not applicable  INTERVENTION:   -Once diet is advanced, add:   -Ensure Enlive po BID, each supplement provides 350 kcal and 20 grams of protein  -MVI with minerals daily  NUTRITION DIAGNOSIS:   Increased nutrient needs related to post-op healing as evidenced by estimated needs.  GOAL:   Patient will meet greater than or equal to 90% of their needs  MONITOR:   PO intake, Diet advancement, Supplement acceptance, Labs, Weight trends, Skin, I & O's  REASON FOR ASSESSMENT:   Consult Assessment of nutrition requirement/status  ASSESSMENT:   Leslie Mcknight is a 86 y.o. female with medical history significant for dementia slipped at home at fell down last night and has been unable to get up since the fall. She complains of left hip pain.  She says she did not hit head. She c/o left knee pain. She denies fever and chills. She denies chest pain.  She was found by family members in the home on the floor.  Pt admitted with lt hip fracture s/p fall.   Reviewed I/O's: +1.2 L x 24 hours  UOP: 500 ml x 24 hours  Per orthopedics notes, plan for surgery today. Pt currently NPO for procedure.   Pt unavailable at time of visit. RD unable to obtain further nutrition-related history or complete nutrition-focused physical exam at this time.    Reviewed wt hx; pt has experienced a 2.4% wt loss over the past 9 months, which is not significant for time frame.   Pt with increased nutritional needs for post-operative healing and would benefit from addition of oral nutrition supplements.  Medications reviewed and include lactated ringers infusion @ 10 ml/hr and ferrous sulfate.   Labs reviewed.   Diet Order:   Diet Order             Diet NPO time specified Except for: Sips with Meds  Diet effective midnight           Diet NPO time specified  Diet effective midnight                   EDUCATION NEEDS:   No education needs  have been identified at this time  Skin:  Skin Assessment: Reviewed RN Assessment  Last BM:  05/14/21  Height:   Ht Readings from Last 1 Encounters:  05/15/21 '5\' 1"'$  (1.549 m)    Weight:   Wt Readings from Last 1 Encounters:  05/14/21 52.2 kg    Ideal Body Weight:  50 kg  BMI:  Body mass index is 21.73 kg/m.  Estimated Nutritional Needs:   Kcal:  1550-1750  Protein:  80-95 grams  Fluid:  > 1.5 L    Loistine Chance, RD, LDN, [place] Registered Dietitian II Certified Diabetes Care and Education Specialist Please refer to [facility] for RD and/or RD on-call/weekend/after hours pager

## 2021-05-15 NOTE — Plan of Care (Signed)

## 2021-05-15 NOTE — Transfer of Care (Signed)
Immediate Anesthesia Transfer of Care Note  Patient: Leslie Mcknight  Procedure(s) Performed: INTRAMEDULLARY (IM) NAIL INTERTROCHANTRIC (Left)  Patient Location: PACU  Anesthesia Type:General  Level of Consciousness: awake, alert  and oriented  Airway & Oxygen Therapy: Patient Spontanous Breathing and Patient connected to face mask oxygen  Post-op Assessment: Report given to RN and Post -op Vital signs reviewed and stable  Post vital signs: Reviewed and stable  Last Vitals:  Vitals Value Taken Time  BP 152/85 05/15/21 1424  Temp 36.6 C 05/15/21 1425  Pulse 82 05/15/21 1433  Resp 17 05/15/21 1433  SpO2 100 % 05/15/21 1433  Vitals shown include unvalidated device data.  Last Pain:  Vitals:   05/15/21 1425  TempSrc:   PainSc: 0-No pain         Complications: No notable events documented.

## 2021-05-15 NOTE — Anesthesia Preprocedure Evaluation (Addendum)
Anesthesia Evaluation  Patient identified by MRN, date of birth, ID band Patient confused    Reviewed: Allergy & Precautions, NPO status , Patient's Chart, lab work & pertinent test results  Airway Mallampati: II  TM Distance: >3 FB Neck ROM: Full    Dental  (+) Poor Dentition, Missing   Pulmonary neg pulmonary ROS,    Pulmonary exam normal        Cardiovascular negative cardio ROS   Rhythm:Regular Rate:Normal     Neuro/Psych Depression Dementia negative neurological ROS     GI/Hepatic negative GI ROS, Neg liver ROS,   Endo/Other  negative endocrine ROS  Renal/GU negative Renal ROS  negative genitourinary   Musculoskeletal Left hip fracture   Abdominal (+)  Abdomen: soft. Bowel sounds: normal.  Peds  Hematology negative hematology ROS (+)   Anesthesia Other Findings   Reproductive/Obstetrics                            Anesthesia Physical Anesthesia Plan  ASA: 3  Anesthesia Plan: General   Post-op Pain Management:    Induction: Intravenous  PONV Risk Score and Plan: 3 and Ondansetron, Dexamethasone and Treatment may vary due to age or medical condition  Airway Management Planned: Mask and Oral ETT  Additional Equipment: None  Intra-op Plan:   Post-operative Plan: Extubation in OR  Informed Consent: I have reviewed the patients History and Physical, chart, labs and discussed the procedure including the risks, benefits and alternatives for the proposed anesthesia with the patient or authorized representative who has indicated his/her understanding and acceptance.     Dental advisory given  Plan Discussed with: CRNA  Anesthesia Plan Comments: (Lab Results      Component                Value               Date                      WBC                      8.9                 05/15/2021                HGB                      11.2 (L)            05/15/2021                 HCT                      35.4 (L)            05/15/2021                MCV                      94.9                05/15/2021                PLT                      238  05/15/2021           Lab Results      Component                Value               Date                      NA                       136                 05/15/2021                K                        3.7                 05/15/2021                CO2                      22                  05/15/2021                GLUCOSE                  99                  05/15/2021                BUN                      7 (L)               05/15/2021                CREATININE               0.82                05/15/2021                CALCIUM                  7.8 (L)             05/15/2021                GFRNONAA                 >60                 05/15/2021          )       Anesthesia Quick Evaluation

## 2021-05-15 NOTE — Anesthesia Procedure Notes (Signed)
Procedure Name: Intubation Date/Time: 05/15/2021 1:04 PM Performed by: Ignacia Bayley, CRNA Pre-anesthesia Checklist: Patient identified, Emergency Drugs available, Suction available and Patient being monitored Patient Re-evaluated:Patient Re-evaluated prior to induction Oxygen Delivery Method: Circle System Utilized Preoxygenation: Pre-oxygenation with 100% oxygen Induction Type: IV induction Ventilation: Mask ventilation without difficulty Laryngoscope Size: Miller and 2 Grade View: Grade I Tube type: Oral Tube size: 7.0 mm Number of attempts: 1 Airway Equipment and Method: Stylet and Oral airway Placement Confirmation: ETT inserted through vocal cords under direct vision, positive ETCO2 and breath sounds checked- equal and bilateral Secured at: 22 cm Tube secured with: Tape Dental Injury: Teeth and Oropharynx as per pre-operative assessment

## 2021-05-15 NOTE — Consult Note (Signed)
ORTHOPAEDIC CONSULTATION  REQUESTING PHYSICIAN: Dwyane Dee, MD  PCP:  Pcp, No  Chief Complaint: left hip injury  HPI: Leslie Mcknight is a 86 y.o. female with past medical history of dementia who fell at home last night.  She had left hip pain and inability to weight-bear.  She was brought to the emergency department at [facility], where x-rays revealed a displaced left femoral neck fracture.  She was then admitted by [facility] [place] consultation was placed for management of her hip fracture.  She denies other injuries.  Past Medical History:  Diagnosis Date   Dementia ([place])    UTI (urinary tract infection)    History reviewed. No pertinent surgical history. Social History   Socioeconomic History   Marital status: Widowed    Spouse name: Not on file   Number of children: Not on file   Years of education: Not on file   Highest education level: Not on file  Occupational History   Not on file  Tobacco Use   Smoking status: Never   Smokeless tobacco: Never  Substance and Sexual Activity   Alcohol use: Never   Drug use: Never   Sexual activity: Not on file  Other Topics Concern   Not on file  Social History Narrative   Not on file   Social Determinants of Health   Financial Resource Strain: Not on file  Food Insecurity: Not on file  Transportation Needs: Not on file  Physical Activity: Not on file  Stress: Not on file  Social Connections: Not on file   No family history on file. No Known Allergies Prior to Admission medications   Medication Sig Start Date End Date Taking? Authorizing Provider  nitrofurantoin (MACRODANTIN) 100 MG capsule Take 100 mg by mouth 2 (two) times daily. Patient not taking: Reported on 05/14/2021 07/22/20   [provider]   DG Knee Left Port  Result Date: 05/14/2021 CLINICAL DATA:  Fall. EXAM: PORTABLE LEFT KNEE - 1-2 VIEW COMPARISON:  None. FINDINGS: No evidence of fracture, dislocation, or joint  effusion. Moderate narrowing of medial joint space is noted. Mild narrowing of lateral joint space is noted. Soft tissues are unremarkable. IMPRESSION: Moderate degenerative joint disease.  No acute abnormality seen. Electronically Signed   By: Marijo Conception M.D.   On: 05/14/2021 13:51   DG Hip Unilat With Pelvis 2-3 Views Left  Result Date: 05/14/2021 CLINICAL DATA:  Fall this morning with left hip pain with deformity noted EXAM: DG HIP (WITH OR WITHOUT PELVIS) 2-3V LEFT COMPARISON:  None. FINDINGS: Comminuted nondisplaced intertrochanteric proximal left femur fracture. No hip dislocation. No pelvic diastasis. Surgical clips throughout the right greater than left pelvis. Degenerative changes in the visualized lower lumbar spine. IMPRESSION: Comminuted nondisplaced intertrochanteric proximal left femur fracture. Electronically Signed   By: Ilona Sorrel M.D.   On: 05/14/2021 10:39    Positive ROS: All other systems have been reviewed and were otherwise negative with the exception of those mentioned in the HPI and as above.  Physical Exam: General: Alert, no acute distress Cardiovascular: No pedal edema Respiratory: No cyanosis, no use of accessory musculature GI: No organomegaly, abdomen is soft and non-tender Skin: No lesions in the area of chief complaint Neurologic: Sensation intact distally Psychiatric: Patient is competent for consent with normal mood and affect Lymphatic: No axillary or cervical lymphadenopathy  MUSCULOSKELETAL: Examination left hip reveals no skin wounds or lesions.  She is shortened and rotated.  Pain with attempted logrolling of the hip.  She is neurovascularly intact distally.  Assessment: Comminuted left intertrochanteric femur fracture  Plan: I discussed the findings with the patient and her niece.  She has an unstable left hip fracture that requires surgical treatment.  We discussed the risk, benefits, and alternatives to intramedullary fixation of the left  femur.  Please see statement of risk.  Plan for surgery today.  All questions were solicited and answered  The risks, benefits, and alternatives were discussed with the patient. There are risks associated with the surgery including, but not limited to, problems with anesthesia (death), infection, differences in leg length/angulation/rotation, fracture of bones, loosening or failure of implants, malunion, nonunion, hematoma (blood accumulation) which may require surgical drainage, blood clots, pulmonary embolism, nerve injury (foot drop), and blood vessel injury. The patient understands these risks and elects to proceed.     Bertram Savin, MD [phone]    05/15/2021 12:00 PM

## 2021-05-15 NOTE — Assessment & Plan Note (Addendum)
-   L>R, but overall improving now - duplex not done since admission yet - follow up LE duplex

## 2021-05-15 NOTE — Discharge Instructions (Signed)
 Dr. Nekisha Mcdiarmid Adult Hip & Knee Specialist [facility] [address] [place], [place] [address] [phone]   POSTOPERATIVE DIRECTIONS    Hip Rehabilitation, Guidelines Following Surgery   WEIGHT BEARING Weight bearing as tolerated with assist device (walker, cane, etc) as directed, use it as long as suggested by your surgeon or therapist, typically at least 4-6 weeks.   HOME CARE INSTRUCTIONS  Remove items at home which could result in a fall. This includes throw rugs or furniture in walking pathways.  Continue medications as instructed at time of discharge.  You may have some home medications which will be placed on hold until you complete the course of blood thinner medication.  4 days after discharge, you may start showering. No tub baths or soaking your incisions. Do not put on socks or shoes without following the instructions of your caregivers.   Sit on chairs with arms. Use the chair arms to help push yourself up when arising.  Arrange for the use of a toilet seat elevator so you are not sitting low.   Walk with walker as instructed.  You may resume a sexual relationship in one month or when given the OK by your caregiver.  Use walker as long as suggested by your caregivers.  Avoid periods of inactivity such as sitting longer than an hour when not asleep. This helps prevent blood clots.  You may return to work once you are cleared by your surgeon.  Do not drive a car for 6 weeks or until released by your surgeon.  Do not drive while taking narcotics.  Wear elastic stockings for two weeks following surgery during the day but you may remove then at night.  Make sure you keep all of your appointments after your operation with all of your doctors and caregivers. You should call the office at the above phone number and make an appointment for approximately two weeks after the date of your surgery. Please pick up a stool softener and laxative  for home use as long as you are requiring pain medications.  ICE to the affected hip every three hours for 30 minutes at a time and then as needed for pain and swelling. Continue to use ice on the hip for pain and swelling from surgery. You may notice swelling that will progress down to the foot and ankle.  This is normal after surgery.  Elevate the leg when you are not up walking on it.   It is important for you to complete the blood thinner medication as prescribed by your doctor.  Continue to use the breathing machine which will help keep your temperature down.  It is common for your temperature to cycle up and down following surgery, especially at night when you are not up moving around and exerting yourself.  The breathing machine keeps your lungs expanded and your temperature down.  RANGE OF MOTION AND STRENGTHENING EXERCISES  These exercises are designed to help you keep full movement of your hip joint. Follow your caregiver's or physical therapist's instructions. Perform all exercises about fifteen times, three times per day or as directed. Exercise both hips, even if you have had only one joint replacement. These exercises can be done on a training (exercise) mat, on the floor, on a table or on a bed. Use whatever works the best and is most comfortable for you. Use music or television while you are exercising so that the exercises are a pleasant break in your day. This   will make your life better with the exercises acting as a break in routine you can look forward to.  Lying on your back, slowly slide your foot toward your buttocks, raising your knee up off the floor. Then slowly slide your foot back down until your leg is straight again.  Lying on your back spread your legs as far apart as you can without causing discomfort.  Lying on your side, raise your upper leg and foot straight up from the floor as far as is comfortable. Slowly lower the leg and repeat.  Lying on your back, tighten up the  muscle in the front of your thigh (quadriceps muscles). You can do this by keeping your leg straight and trying to raise your heel off the floor. This helps strengthen the largest muscle supporting your knee.  Lying on your back, tighten up the muscles of your buttocks both with the legs straight and with the knee bent at a comfortable angle while keeping your heel on the floor.   SKILLED REHAB INSTRUCTIONS: If the patient is transferred to a skilled rehab facility following release from the hospital, a list of the current medications will be sent to the facility for the patient to continue.  When discharged from the skilled rehab facility, please have the facility set up the patient's Home Health Physical Therapy prior to being released. Also, the skilled facility will be responsible for providing the patient with their medications at time of release from the facility to include their pain medication and their blood thinner medication. If the patient is still at the rehab facility at time of the two week follow up appointment, the skilled rehab facility will also need to assist the patient in arranging follow up appointment in our office and any transportation needs.  MAKE SURE YOU:  Understand these instructions.  Will watch your condition.  Will get help right away if you are not doing well or get worse.  Pick up stool softner and laxative for home use following surgery while on pain medications. Daily dry dressing changes as needed. In 4 days, you may remove your dressings and begin taking showers - no tub baths or soaking the incisions. Continue to use ice for pain and swelling after surgery. Do not use any lotions or creams on the incision until instructed by your surgeon.   

## 2021-05-15 NOTE — TOC CAGE-AID Note (Signed)
Transition of Care [facility]) - CAGE-AID Screening   Patient Details  Name: Leslie Mcknight MRN: [id number] Date of Birth: September 26, 1935  Transition of Care [facility]) CM/SW Contact:    Jaid Quirion C Tarpley-Carter, [place] Phone Number: 05/15/2021, 11:03 AM   Clinical Narrative: Pt is unable to participate in Cage Aid. Pt has dementia.  Baker Moronta Tarpley-Carter, MSW, LCSW-A Pronouns:  She/Her/Hers [facility]itions [facility] Care Clinical Social Worker Direct Number:  [phone] [email or web address]   CAGE-AID Screening: Substance Abuse Screening unable to be completed due to: : Patient unable to participate

## 2021-05-15 NOTE — Progress Notes (Signed)
Progress Note    Leslie Mcknight   [id number]  DOB: 04/28/1935  DOA: 05/14/2021     1  PCP: Pcp, No  CC: fall at home  Hospital Course: Leslie Mcknight is an 86 yo female with PMH dementia who presented after a mechanical fall at home. She noted left hip pain after falling.  On evaluation in the ER she was found to have a comminuted nondisplaced intertrochanteric proximal left femur fracture.  She was evaluated by orthopedic surgery after transfer from [facility] to [facility]. She was taken to the OR for repair on 05/15/2021.  Interval History:  Seen in her room this morning.  Still having ongoing pain in her left lower extremity as expected.  Tentative plan is for surgical repair today with orthopedic surgery.  ROS: Constitutional: negative for chills, fatigue, and fevers, Respiratory: negative for cough and sputum, Cardiovascular: negative for chest pain, and Gastrointestinal: negative for abdominal pain  Assessment & Plan: * Closed comminuted intertrochanteric fracture of proximal end of left femur ([place]) - "Comminuted nondisplaced intertrochanteric proximal left femur Fracture." - plan is for repair on 05/15/21 -PT/OT after surgery - DVT prophylaxis per orthopedic surgery postop  Leg swelling - L>R - follow up LE duplex   Leukocytosis - likely reactive s/p fall  Hyperglycemia - check A1c - likely reactive  Dementia ([name]) - continue delirium precautions    Old records reviewed in assessment of this patient  Antimicrobials:   DVT prophylaxis: Place TED hose Start: 05/14/21 1323   Code Status:  Code Status: Full Code Family Communication:   Disposition Plan: Status is: Inpatient  Remains inpatient appropriate because:IV treatments appropriate due to intensity of illness or inability to take PO and Inpatient level of care appropriate due to severity of illness  Dispo: The patient is from: Home              Anticipated d/c is to:  Likely SNF after PT  evaluation postop              Patient currently is not medically stable to d/c.   Difficult to place patient No      Risk of unplanned readmission score: Unplanned Admission- Pilot do not use: 10.26   Objective: Blood pressure (!) 173/69, pulse 80, temperature 97.6 F (36.4 C), temperature source Oral, resp. rate 16, height '5\' 1"'$  (1.549 m), weight 52.2 kg, SpO2 95 %.  Examination: General appearance: alert, cooperative, and no distress Head: Normocephalic, without obvious abnormality, atraumatic Eyes:  EOMI Lungs: clear to auscultation bilaterally Heart: regular rate and rhythm and S1, S2 normal Abdomen: normal findings: bowel sounds normal and soft, non-tender Extremities:  Pain in left lower extremity especially with any left hip flexion passively.   Skin: mobility and turgor normal Neurologic: Grossly normal  Consultants:  Orthopedic surgery  Procedures:    Data Reviewed: I have personally reviewed following labs and imaging studies Results for orders placed or performed during the hospital encounter of 05/14/21 (from the past 24 hour(s))  VITAMIN D 25 Hydroxy (Vit-D Deficiency, Fractures)     Status: None   Collection Time: 05/14/21  2:26 PM  Result Value Ref Range   Vit D, 25-Hydroxy 48.67 30 - 100 ng/mL  Type and screen [place]     Status: None   Collection Time: 05/14/21  7:13 PM  Result Value Ref Range   ABO/RH(D) B POS    Antibody Screen NEG    Sample Expiration  05/17/2021,2359 Performed at [facility] [address] [place], [place] [id number]   Basic metabolic panel     Status: Abnormal   Collection Time: 05/15/21  1:47 AM  Result Value Ref Range   Sodium 136 135 - 145 mmol/L   Potassium 3.7 3.5 - 5.1 mmol/L   Chloride 107 98 - 111 mmol/L   CO2 22 22 - 32 mmol/L   Glucose, Bld 99 70 - 99 mg/dL   BUN 7 (L) 8 - 23 mg/dL   Creatinine, Ser 0.82 0.44 - 1.00 mg/dL   Calcium 7.8 (L) 8.9 - 10.3 mg/dL   GFR, Estimated >60  >60 mL/min   Anion gap 7 5 - 15  CBC     Status: Abnormal   Collection Time: 05/15/21  1:47 AM  Result Value Ref Range   WBC 8.9 4.0 - 10.5 K/uL   RBC 3.73 (L) 3.87 - 5.11 MIL/uL   Hemoglobin 11.2 (L) 12.0 - 15.0 g/dL   HCT 35.4 (L) 36.0 - 46.0 %   MCV 94.9 80.0 - 100.0 fL   MCH 30.0 26.0 - 34.0 pg   MCHC 31.6 30.0 - 36.0 g/dL   RDW 13.0 11.5 - 15.5 %   Platelets 238 150 - 400 K/uL   nRBC 0.0 0.0 - 0.2 %  Surgical pcr screen     Status: None   Collection Time: 05/15/21  5:06 AM   Specimen: Nasal Mucosa; Nasal Swab  Result Value Ref Range   MRSA, PCR NEGATIVE NEGATIVE   Staphylococcus aureus NEGATIVE NEGATIVE    Recent Results (from the past 240 hour(s))  Resp Panel by RT-PCR (Flu A&B, Covid) Nasopharyngeal Swab     Status: None   Collection Time: 05/14/21  9:28 AM   Specimen: Nasopharyngeal Swab; Nasopharyngeal(NP) swabs in vial transport medium  Result Value Ref Range Status   SARS Coronavirus 2 by RT PCR NEGATIVE NEGATIVE Final    Comment: (NOTE) SARS-CoV-2 target nucleic acids are NOT DETECTED.  The SARS-CoV-2 RNA is generally detectable in upper respiratory specimens during the acute phase of infection. The lowest concentration of SARS-CoV-2 viral copies this assay can detect is 138 copies/mL. A negative result does not preclude SARS-Cov-2 infection and should not be used as the sole basis for treatment or other patient management decisions. A negative result may occur with  improper specimen collection/handling, submission of specimen other than nasopharyngeal swab, presence of viral mutation(s) within the areas targeted by this assay, and inadequate number of viral copies(<138 copies/mL). A negative result must be combined with clinical observations, patient history, and epidemiological information. The expected result is Negative.  Fact Sheet for Patients:  EntrepreneurPulse.com.au  Fact Sheet for Healthcare Providers:   IncredibleEmployment.be  This test is no t yet approved or cleared by the [place] FDA and  has been authorized for detection and/or diagnosis of SARS-CoV-2 by FDA under an Emergency Use Authorization (EUA). This EUA will remain  in effect (meaning this test can be used) for the duration of the COVID-19 declaration under Section 564(b)(1) of the Act, 21 U.S.C.section 360bbb-3(b)(1), unless the authorization is terminated  or revoked sooner.       Influenza A by PCR NEGATIVE NEGATIVE Final   Influenza B by PCR NEGATIVE NEGATIVE Final    Comment: (NOTE) The Xpert Xpress SARS-CoV-2/FLU/RSV plus assay is intended as an aid in the diagnosis of influenza from Nasopharyngeal swab specimens and should not be used as a sole basis for treatment. Nasal washings and  aspirates are unacceptable for Xpert Xpress SARS-CoV-2/FLU/RSV testing.  Fact Sheet for Patients: EntrepreneurPulse.com.au  Fact Sheet for Healthcare Providers: IncredibleEmployment.be  This test is not yet approved or cleared by the [place] FDA and has been authorized for detection and/or diagnosis of SARS-CoV-2 by FDA under an Emergency Use Authorization (EUA). This EUA will remain in effect (meaning this test can be used) for the duration of the COVID-19 declaration under Section 564(b)(1) of the Act, 21 U.S.C. section 360bbb-3(b)(1), unless the authorization is terminated or revoked.  Performed at [facility], [address], [place], [place] [address]   Surgical pcr screen     Status: None   Collection Time: 05/15/21  5:06 AM   Specimen: Nasal Mucosa; Nasal Swab  Result Value Ref Range Status   MRSA, PCR NEGATIVE NEGATIVE Final   Staphylococcus aureus NEGATIVE NEGATIVE Final    Comment: (NOTE) The Xpert SA Assay (FDA approved for NASAL specimens in patients 34 years of age and older), is one component of a comprehensive surveillance program. It is not  intended to diagnose infection nor to guide or monitor treatment. Performed at [facility], [place] [address] [place], [place] [address]      Radiology Studies: DG Knee Left Port  Result Date: 05/14/2021 CLINICAL DATA:  Fall. EXAM: PORTABLE LEFT KNEE - 1-2 VIEW COMPARISON:  None. FINDINGS: No evidence of fracture, dislocation, or joint effusion. Moderate narrowing of medial joint space is noted. Mild narrowing of lateral joint space is noted. Soft tissues are unremarkable. IMPRESSION: Moderate degenerative joint disease.  No acute abnormality seen. Electronically Signed   By: Marijo Conception M.D.   On: 05/14/2021 13:51   DG Hip Unilat With Pelvis 2-3 Views Left  Result Date: 05/14/2021 CLINICAL DATA:  Fall this morning with left hip pain with deformity noted EXAM: DG HIP (WITH OR WITHOUT PELVIS) 2-3V LEFT COMPARISON:  None. FINDINGS: Comminuted nondisplaced intertrochanteric proximal left femur fracture. No hip dislocation. No pelvic diastasis. Surgical clips throughout the right greater than left pelvis. Degenerative changes in the visualized lower lumbar spine. IMPRESSION: Comminuted nondisplaced intertrochanteric proximal left femur fracture. Electronically Signed   By: Ilona Sorrel M.D.   On: 05/14/2021 10:39   DG Knee Left Port  Final Result    DG Hip Unilat With Pelvis 2-3 Views Left  Final Result    US Venous Img Lower Bilateral (DVT)    (Results Pending)    Scheduled Meds:  chlorhexidine  60 mL Topical Once   chlorhexidine       [MAR Hold] feeding supplement  296 mL Oral Once   [MAR Hold] ferrous sulfate  325 mg Oral Q breakfast   povidone-iodine  2 application Topical Once   PRN Meds: [MAR Hold] bisacodyl, [MAR Hold] HYDROcodone-acetaminophen, [MAR Hold]  morphine injection Continuous Infusions:  sodium chloride 100 mL/hr at 05/14/21 2020    ceFAZolin (ANCEF) IV     lactated ringers     tranexamic acid     [MAR Hold] tranexamic acid Stopped (05/14/21 1446)      LOS: 1 day  Time spent: Greater than 50% of the 35 minute visit was spent in counseling/coordination of care for the patient as laid out in the A&P.   Dwyane Dee, MD [facility] 05/15/2021, 12:12 PM

## 2021-05-15 NOTE — Hospital Course (Signed)
Leslie Mcknight is an 86 yo female with PMH dementia who presented after a mechanical fall at home. She noted left hip pain after falling.  On evaluation in the ER she was found to have a comminuted nondisplaced intertrochanteric proximal left femur fracture.  She was evaluated by orthopedic surgery after transfer from [facility] to [facility]. She was taken to the OR for repair on 05/15/2021.

## 2021-05-15 NOTE — Assessment & Plan Note (Signed)
-   continue delirium precautions

## 2021-05-15 NOTE — TOC Progression Note (Signed)
Transition of Care [facility]) - Progression Note    Patient Details  Name: Leslie Mcknight MRN: [id number] Date of Birth: 01-21-1935  Transition of Care [facility]) CM/SW Contact  Milinda Antis, [place] Phone Number: 05/15/2021, 4:18 PM  Clinical Narrative:     CSW following patient for any d/c planning needs once medically stable.  Lind Covert, MSW, LCSWA        Expected Discharge Plan and Services                                                 Social Determinants of Health (SDOH) Interventions    Readmission Risk Interventions No flowsheet data found.

## 2021-05-16 ENCOUNTER — Encounter (HOSPITAL_COMMUNITY): Payer: Self-pay | Admitting: Orthopedic Surgery

## 2021-05-16 DIAGNOSIS — S72142A Displaced intertrochanteric fracture of left femur, initial encounter for closed fracture: Secondary | ICD-10-CM | POA: Diagnosis not present

## 2021-05-16 LAB — BASIC METABOLIC PANEL
Anion gap: 7 (ref 5–15)
BUN: 8 mg/dL (ref 8–23)
CO2: 23 mmol/L (ref 22–32)
Calcium: 7.7 mg/dL — ABNORMAL LOW (ref 8.9–10.3)
Chloride: 105 mmol/L (ref 98–111)
Creatinine, Ser: 0.86 mg/dL (ref 0.44–1.00)
GFR, Estimated: >60 mL/min (ref >60)
Glucose, Bld: 115 mg/dL — ABNORMAL HIGH (ref 70–99)
Potassium: 3.8 mmol/L (ref 3.5–5.1)
Sodium: 135 mmol/L (ref 135–145)

## 2021-05-16 LAB — CBC WITH DIFFERENTIAL/PLATELET
Abs Immature Granulocytes: 0.03 10*3/uL (ref 0.00–0.07)
Basophils Absolute: 0.1 10*3/uL (ref 0.0–0.1)
Basophils Relative: 1 %
Eosinophils Absolute: 0 10*3/uL (ref 0.0–0.5)
Eosinophils Relative: 0 %
HCT: 29.7 % — ABNORMAL LOW (ref 36.0–46.0)
Hemoglobin: 9.5 g/dL — ABNORMAL LOW (ref 12.0–15.0)
Immature Granulocytes: 0 %
Lymphocytes Relative: 9 %
Lymphs Abs: 0.8 10*3/uL (ref 0.7–4.0)
MCH: 29.8 pg (ref 26.0–34.0)
MCHC: 32 g/dL (ref 30.0–36.0)
MCV: 93.1 fL (ref 80.0–100.0)
Monocytes Absolute: 0.9 10*3/uL (ref 0.1–1.0)
Monocytes Relative: 10 %
Neutro Abs: 7.2 10*3/uL (ref 1.7–7.7)
Neutrophils Relative %: 80 %
Platelets: 194 10*3/uL (ref 150–400)
RBC: 3.19 MIL/uL — ABNORMAL LOW (ref 3.87–5.11)
RDW: 12.9 % (ref 11.5–15.5)
WBC: 9 10*3/uL (ref 4.0–10.5)
nRBC: 0 % (ref 0.0–0.2)

## 2021-05-16 MED ORDER — ADULT MULTIVITAMIN W/MINERALS CH
1.0000 | ORAL_TABLET | Freq: Every day | ORAL | Status: DC
  Administered 2021-05-16 – 2021-05-18 (×3): 1 via ORAL
  Filled 2021-05-16 (×3): qty 1

## 2021-05-16 NOTE — Anesthesia Postprocedure Evaluation (Signed)
Anesthesia Post Note  Patient: Leslie Mcknight  Procedure(s) Performed: INTRAMEDULLARY (IM) NAIL INTERTROCHANTRIC (Left)     Patient location during evaluation: PACU Anesthesia Type: General Level of consciousness: awake and alert Pain management: pain level controlled Vital Signs Assessment: post-procedure vital signs reviewed and stable Respiratory status: spontaneous breathing, nonlabored ventilation, respiratory function stable and patient connected to nasal cannula oxygen Cardiovascular status: blood pressure returned to baseline and stable Postop Assessment: no apparent nausea or vomiting Anesthetic complications: no   No notable events documented.  Last Vitals:  Vitals:   05/16/21 0046 05/16/21 0419  BP: 132/60 134/64  Pulse: 94 95  Resp: 19 19  Temp: 36.9 C 37.1 C  SpO2: 98% 97%    Last Pain:  Vitals:   05/16/21 0511  TempSrc:   PainSc: Asleep                 March Rummage Xiomar Crompton

## 2021-05-16 NOTE — Evaluation (Signed)
Physical Therapy Evaluation Patient Details Name: Leslie Mcknight MRN: [id number] DOB: [birth year] Today's Date: 05/16/2021   History of Present Illness  Pt is an 86yo woman s/p fall who sustained a L intertrochanteric fx of L femur who underwent L IM nail on 7/25. PMH: advanced dementia   Clinical Impression  Pt very pleasant and cooperative despite advanced dementia. Pt tolerated OOB to chair with moderate assist. Pt with limited L LE WBing tolerance, as expected with surgery. Pt eager "to get moving and stretched out." Recommend SNF upon d/c as pt lives alone and will need to achieve safe mod I level of function.     Follow Up Recommendations SNF;Supervision/Assistance - 24 hour (per neice paula, plan is for [facility] in [place])    Equipment Recommendations  Other (comment) (TBD at next venue)    Recommendations for Other Services       Precautions / Restrictions Precautions Precautions: Fall Precaution Comments: pt with dementia, no carry over Restrictions Weight Bearing Restrictions: Yes LLE Weight Bearing: Weight bearing as tolerated      Mobility  Bed Mobility Overal bed mobility: Needs Assistance Bed Mobility: Supine to Sit     Supine to sit: Max assist;HOB elevated     General bed mobility comments: increased time, max verbal cues, maxA for trunk elevation, pt able to bring bilat LEs towards EOB, max to pivot to eob, pt able to scoot using bilat UEs with verbal cues to get feet on floor    Transfers Overall transfer level: Needs assistance Equipment used: Rolling walker (2 wheeled) Transfers: Sit to/from [facility] Sit to Stand: Mod assist Stand pivot transfers: Mod assist       General transfer comment: max directional vebal cues, decreased L LE WBing tolerance, max tactile cues for weight shifting to the R to advance L LE during stand pvt, increased time  Ambulation/Gait             General Gait Details: unable at this  time  Stairs            Wheelchair Mobility    Modified Rankin (Stroke Patients Only)       Balance Overall balance assessment: Needs assistance Sitting-balance support: Feet supported;Bilateral upper extremity supported Sitting balance-Leahy Scale: Fair Sitting balance - Comments: pt using bilat UEs to off weight L hip in addition to slight R lateral lean   Standing balance support: Bilateral upper extremity supported;During functional activity Standing balance-Leahy Scale: Poor Standing balance comment: dependent on external support/physical assist                             Pertinent Vitals/Pain Pain Assessment: Faces Faces Pain Scale: Hurts little more Pain Location: L hip with movement Pain Descriptors / Indicators: Grimacing;Guarding Pain Intervention(s): Monitored during session    Home Living Family/patient expects to be discharged to:: Skilled nursing facility Living Arrangements: Alone               Additional Comments: per neice Nevin Bloodgood the plan is for pt to go to [facility] unable to care for patient    Prior Function Level of Independence: Needs assistance   Gait / Transfers Assistance Needed: per neice pt was amb without AD  ADL's / Homemaking Assistance Needed: per neice pt was refusing to bath but would do it if she wanted to        Hand Dominance   Dominant Hand: Right  Extremity/Trunk Assessment   Upper Extremity Assessment Upper Extremity Assessment: Generalized weakness    Lower Extremity Assessment Lower Extremity Assessment: LLE deficits/detail LLE Deficits / Details: able to complete LAQ and initiate quad set, limited WBing tolerance as expected due to pain    Cervical / Trunk Assessment Cervical / Trunk Assessment: Kyphotic  Communication   Communication: No difficulties  Cognition Arousal/Alertness: Awake/alert Behavior During Therapy: WFL for tasks assessed/performed Overall Cognitive  Status: History of cognitive impairments - at baseline                                 General Comments: pt with dementia, oriented to self and hospital, pt re-oriented to situation and date several times, no carry over, pt chose Biden for president once given choice, no carry over she fell and broke her hip, states she lives with her husband who is in the [employer], despite dementia pt very pleasant, cooperative and able to follow command      General Comments General comments (skin integrity, edema, etc.): incision covered with bandage, no drainage, SpO2 82-98% however poor waveform, pt color good and no SOB    Exercises General Exercises - Lower Extremity Ankle Circles/Pumps: AROM;Both;10 reps;Supine Quad Sets: AROM;Left;5 reps;Supine Long Arc Quad: AROM;Both;10 reps;Seated (AA to achieve L full knee ext) Hip Flexion/Marching: AAROM;Both;10 reps;Seated   Assessment/Plan    PT Assessment Patient needs continued PT services  PT Problem List Decreased strength;Decreased range of motion;Decreased balance;Decreased activity tolerance;Decreased mobility;Decreased coordination;Pain;Decreased cognition;Decreased knowledge of use of DME;Decreased safety awareness       PT Treatment Interventions DME instruction;Gait training;Stair training;Functional mobility training;Therapeutic activities;Therapeutic exercise;Balance training;Neuromuscular re-education    PT Goals (Current goals can be found in the Care Plan section)  Acute Rehab PT Goals Patient Stated Goal: didn't state PT Goal Formulation: With patient Time For Goal Achievement: 05/30/21 Potential to Achieve Goals: Good    Frequency Min 3X/week   Barriers to discharge Decreased caregiver support lives alone    Co-evaluation               AM-PAC PT "6 Clicks" Mobility  Outcome Measure Help needed turning from your back to your side while in a flat bed without using bedrails?: A Little Help needed moving  from lying on your back to sitting on the side of a flat bed without using bedrails?: A Little Help needed moving to and from a bed to a chair (including a wheelchair)?: A Little Help needed standing up from a chair using your arms (e.g., wheelchair or bedside chair)?: A Lot Help needed to walk in hospital room?: A Lot Help needed climbing 3-5 steps with a railing? : A Lot 6 Click Score: 15    End of Session Equipment Utilized During Treatment: Gait belt Activity Tolerance: Patient tolerated treatment well Patient left: in chair;with call bell/phone within reach;with chair alarm set (with OT) Nurse Communication: Mobility status PT Visit Diagnosis: Unsteadiness on feet (R26.81);Muscle weakness (generalized) (M62.81);Difficulty in walking, not elsewhere classified (R26.2)    Time: [id number] PT Time Calculation (min) (ACUTE ONLY): 24 min   Charges:   PT Evaluation $PT Eval Moderate Complexity: 1 Mod PT Treatments $Therapeutic Activity: 8-22 mins        Kittie Plater, PT, DPT Acute Rehabilitation Services Pager #: [phone] Office #: [phone]   [place] 05/16/2021, 9:00 AM

## 2021-05-16 NOTE — Plan of Care (Signed)
  Problem: Clinical Measurements: Goal: Respiratory complications will improve Outcome: Progressing   Problem: Clinical Measurements: Goal: Cardiovascular complication will be avoided Outcome: Progressing   Problem: Activity: Goal: Risk for activity intolerance will decrease Outcome: Progressing   Problem: Nutrition: Goal: Adequate nutrition will be maintained Outcome: Progressing   Problem: Clinical Measurements: Goal: Ability to maintain clinical measurements within normal limits will improve Outcome: Progressing   Problem: Coping: Goal: Level of anxiety will decrease Outcome: Progressing   Problem: Elimination: Goal: Will not experience complications related to bowel motility Outcome: Progressing Goal: Will not experience complications related to urinary retention Outcome: Progressing   Problem: Safety: Goal: Ability to remain free from injury will improve Outcome: Progressing   Problem: Pain Managment: Goal: General experience of comfort will improve Outcome: Progressing   Problem: Skin Integrity: Goal: Risk for impaired skin integrity will decrease Outcome: Progressing

## 2021-05-16 NOTE — Progress Notes (Signed)
Progress Note    Leslie Mcknight   [id number]  DOB: Jul 18, 1935  DOA: 05/14/2021     2  PCP: Pcp, No  CC: fall at home  Hospital Course: Leslie Mcknight is an 86 yo female with PMH dementia who presented after a mechanical fall at home. She noted left hip pain after falling.  On evaluation in the ER she was found to have a comminuted nondisplaced intertrochanteric proximal left femur fracture.  She was evaluated by orthopedic surgery after transfer from [facility] to [facility]. She was taken to the OR for repair on 05/15/2021.  Interval History:  No events overnight. Dispo being pursued.   ROS: Constitutional: negative for chills, fatigue, and fevers, Respiratory: negative for cough and sputum, Cardiovascular: negative for chest pain, and Gastrointestinal: negative for abdominal pain  Assessment & Plan: * Closed comminuted intertrochanteric fracture of proximal end of left femur ([place]) - "Comminuted nondisplaced intertrochanteric proximal left femur Fracture." - s/p IM nail fixation 05/15/21 -PT/OT after surgery - DVT prophylaxis, Lovenox   Leg swelling - L>R - follow up LE duplex   Leukocytosis - likely reactive s/p fall  Hyperglycemia - check A1c - likely reactive  Dementia ([place]) - continue delirium precautions   Old records reviewed in assessment of this patient  Antimicrobials:   DVT prophylaxis: enoxaparin (LOVENOX) injection 40 mg Start: 05/16/21 0800 SCDs Start: 05/15/21 1529   Code Status:  Code Status: Full Code Family Communication:   Disposition Plan: Status is: Inpatient  Remains inpatient appropriate because:IV treatments appropriate due to intensity of illness or inability to take PO and Inpatient level of care appropriate due to severity of illness  Dispo: The patient is from: Home              Anticipated d/c is to: SNF              Patient currently is medically stable to d/c.   Difficult to place patient No   Risk of unplanned  readmission score: Unplanned Admission- Pilot do not use: 11.44   Objective: Blood pressure (!) 133/56, pulse 86, temperature 98.6 F (37 C), temperature source Oral, resp. rate 18, height '5\' 1"'$  (1.549 m), weight 52.2 kg, SpO2 99 %.  Examination: General appearance: alert, cooperative, and no distress Head: Normocephalic, without obvious abnormality, atraumatic Eyes:  EOMI Lungs: clear to auscultation bilaterally Heart: regular rate and rhythm and S1, S2 normal Abdomen: normal findings: bowel sounds normal and soft, non-tender Extremities:  Pain in left lower extremity especially with any left hip flexion passively.   Skin: mobility and turgor normal Neurologic: Grossly normal  Consultants:  Orthopedic surgery  Procedures:    Data Reviewed: I have personally reviewed following labs and imaging studies Results for orders placed or performed during the hospital encounter of 05/14/21 (from the past 24 hour(s))  CBC     Status: Abnormal   Collection Time: 05/15/21  4:06 PM  Result Value Ref Range   WBC 15.6 (H) 4.0 - 10.5 K/uL   RBC 3.75 (L) 3.87 - 5.11 MIL/uL   Hemoglobin 11.5 (L) 12.0 - 15.0 g/dL   HCT 35.5 (L) 36.0 - 46.0 %   MCV 94.7 80.0 - 100.0 fL   MCH 30.7 26.0 - 34.0 pg   MCHC 32.4 30.0 - 36.0 g/dL   RDW 12.9 11.5 - 15.5 %   Platelets 226 150 - 400 K/uL   nRBC 0.0 0.0 - 0.2 %  Creatinine, serum     Status: None  Collection Time: 05/15/21  4:06 PM  Result Value Ref Range   Creatinine, Ser 0.82 0.44 - 1.00 mg/dL   GFR, Estimated >60 >60 mL/min  Basic metabolic panel     Status: Abnormal   Collection Time: 05/16/21  3:49 AM  Result Value Ref Range   Sodium 135 135 - 145 mmol/L   Potassium 3.8 3.5 - 5.1 mmol/L   Chloride 105 98 - 111 mmol/L   CO2 23 22 - 32 mmol/L   Glucose, Bld 115 (H) 70 - 99 mg/dL   BUN 8 8 - 23 mg/dL   Creatinine, Ser 0.86 0.44 - 1.00 mg/dL   Calcium 7.7 (L) 8.9 - 10.3 mg/dL   GFR, Estimated >60 >60 mL/min   Anion gap 7 5 - 15  CBC with  Differential/Platelet     Status: Abnormal   Collection Time: 05/16/21  3:49 AM  Result Value Ref Range   WBC 9.0 4.0 - 10.5 K/uL   RBC 3.19 (L) 3.87 - 5.11 MIL/uL   Hemoglobin 9.5 (L) 12.0 - 15.0 g/dL   HCT 29.7 (L) 36.0 - 46.0 %   MCV 93.1 80.0 - 100.0 fL   MCH 29.8 26.0 - 34.0 pg   MCHC 32.0 30.0 - 36.0 g/dL   RDW 12.9 11.5 - 15.5 %   Platelets 194 150 - 400 K/uL   nRBC 0.0 0.0 - 0.2 %   Neutrophils Relative % 80 %   Neutro Abs 7.2 1.7 - 7.7 K/uL   Lymphocytes Relative 9 %   Lymphs Abs 0.8 0.7 - 4.0 K/uL   Monocytes Relative 10 %   Monocytes Absolute 0.9 0.1 - 1.0 K/uL   Eosinophils Relative 0 %   Eosinophils Absolute 0.0 0.0 - 0.5 K/uL   Basophils Relative 1 %   Basophils Absolute 0.1 0.0 - 0.1 K/uL   Immature Granulocytes 0 %   Abs Immature Granulocytes 0.03 0.00 - 0.07 K/uL    Recent Results (from the past 240 hour(s))  Resp Panel by RT-PCR (Flu A&B, Covid) Nasopharyngeal Swab     Status: None   Collection Time: 05/14/21  9:28 AM   Specimen: Nasopharyngeal Swab; Nasopharyngeal(NP) swabs in vial transport medium  Result Value Ref Range Status   SARS Coronavirus 2 by RT PCR NEGATIVE NEGATIVE Final    Comment: (NOTE) SARS-CoV-2 target nucleic acids are NOT DETECTED.  The SARS-CoV-2 RNA is generally detectable in upper respiratory specimens during the acute phase of infection. The lowest concentration of SARS-CoV-2 viral copies this assay can detect is 138 copies/mL. A negative result does not preclude SARS-Cov-2 infection and should not be used as the sole basis for treatment or other patient management decisions. A negative result may occur with  improper specimen collection/handling, submission of specimen other than nasopharyngeal swab, presence of viral mutation(s) within the areas targeted by this assay, and inadequate number of viral copies(<138 copies/mL). A negative result must be combined with clinical observations, patient history, and  epidemiological information. The expected result is Negative.  Fact Sheet for Patients:  EntrepreneurPulse.com.au  Fact Sheet for Healthcare Providers:  IncredibleEmployment.be  This test is no t yet approved or cleared by the [place] FDA and  has been authorized for detection and/or diagnosis of SARS-CoV-2 by FDA under an Emergency Use Authorization (EUA). This EUA will remain  in effect (meaning this test can be used) for the duration of the COVID-19 declaration under Section 564(b)(1) of the Act, 21 U.S.C.section 360bbb-3(b)(1), unless the authorization is terminated  or revoked sooner.       Influenza A by PCR NEGATIVE NEGATIVE Final   Influenza B by PCR NEGATIVE NEGATIVE Final    Comment: (NOTE) The Xpert Xpress SARS-CoV-2/FLU/RSV plus assay is intended as an aid in the diagnosis of influenza from Nasopharyngeal swab specimens and should not be used as a sole basis for treatment. Nasal washings and aspirates are unacceptable for Xpert Xpress SARS-CoV-2/FLU/RSV testing.  Fact Sheet for Patients: EntrepreneurPulse.com.au  Fact Sheet for Healthcare Providers: IncredibleEmployment.be  This test is not yet approved or cleared by the [place] FDA and has been authorized for detection and/or diagnosis of SARS-CoV-2 by FDA under an Emergency Use Authorization (EUA). This EUA will remain in effect (meaning this test can be used) for the duration of the COVID-19 declaration under Section 564(b)(1) of the Act, 21 U.S.C. section 360bbb-3(b)(1), unless the authorization is terminated or revoked.  Performed at [facility], [address], [place], [place] [address]   Surgical pcr screen     Status: None   Collection Time: 05/15/21  5:06 AM   Specimen: Nasal Mucosa; Nasal Swab  Result Value Ref Range Status   MRSA, PCR NEGATIVE NEGATIVE Final   Staphylococcus aureus NEGATIVE NEGATIVE Final     Comment: (NOTE) The Xpert SA Assay (FDA approved for NASAL specimens in patients 28 years of age and older), is one component of a comprehensive surveillance program. It is not intended to diagnose infection nor to guide or monitor treatment. Performed at [facility], [place] [address], [place], [place] [address]      Radiology Studies: Pelvis Portable  Result Date: 05/15/2021 CLINICAL DATA:  Left hip ORIF EXAM: PORTABLE PELVIS 1-2 VIEWS COMPARISON:  05/14/2021 FINDINGS: Interval postsurgical change of left femur ORIF with intramedullary rod and screw fixation. Fracture alignment is improved. Persistent mild displacement of the lesser trochanteric fracture fragment. Hip alignment is maintained without dislocation. Bones are demineralized. Expected postoperative changes within the overlying soft tissues. IMPRESSION: Interval postsurgical change of left femur ORIF with improved fracture alignment. Electronically Signed   By: Davina Poke D.O.   On: 05/15/2021 17:06   DG C-Arm 1-60 Min  Result Date: 05/15/2021 CLINICAL DATA:  Intramedullary nail. Intratrochanteric left femur fracture. EXAM: DG C-ARM 1-60 MIN; LEFT FEMUR 2 VIEWS FLUOROSCOPY TIME:  Fluoroscopy Time:  51 seconds. Radiation Exposure Index (if provided by the fluoroscopic device): 5.05 mGy. Number of Acquired Spot Images: 3 COMPARISON:  May 14, 2021. FINDINGS: Three C-arm fluoroscopic images were obtained intraoperatively and submitted for post operative interpretation. These images demonstrate intramedullary nail and screw fixation of an intertrochanteric femur fracture. Alignment is improved, near anatomic. Please see the performing provider's procedural report for further detail. IMPRESSION: Intraoperative fluoroscopy, as detailed above. Electronically Signed   By: Margaretha Sheffield MD   On: 05/15/2021 14:11   DG FEMUR MIN 2 VIEWS LEFT  Result Date: 05/15/2021 CLINICAL DATA:  Intramedullary nail. Intratrochanteric left femur  fracture. EXAM: DG C-ARM 1-60 MIN; LEFT FEMUR 2 VIEWS FLUOROSCOPY TIME:  Fluoroscopy Time:  51 seconds. Radiation Exposure Index (if provided by the fluoroscopic device): 5.05 mGy. Number of Acquired Spot Images: 3 COMPARISON:  May 14, 2021. FINDINGS: Three C-arm fluoroscopic images were obtained intraoperatively and submitted for post operative interpretation. These images demonstrate intramedullary nail and screw fixation of an intertrochanteric femur fracture. Alignment is improved, near anatomic. Please see the performing provider's procedural report for further detail. IMPRESSION: Intraoperative fluoroscopy, as detailed above. Electronically Signed   By: Albertina Parr  Adah Salvage MD   On: 05/15/2021 14:11   Pelvis Portable  Final Result    DG FEMUR MIN 2 VIEWS LEFT  Final Result    DG C-Arm 1-60 Min  Final Result    DG Knee Left Port  Final Result    DG Hip Unilat With Pelvis 2-3 Views Left  Final Result    US Venous Img Lower Bilateral (DVT)    (Results Pending)    Scheduled Meds:  docusate sodium  100 mg Oral BID   enoxaparin (LOVENOX) injection  40 mg Subcutaneous Q24H   multivitamin with minerals  1 tablet Oral Daily   PRN Meds: acetaminophen, HYDROcodone-acetaminophen, HYDROcodone-acetaminophen, menthol-cetylpyridinium **OR** phenol, methocarbamol **OR** methocarbamol (ROBAXIN) IV, metoCLOPramide **OR** metoCLOPramide (REGLAN) injection, morphine injection, ondansetron **OR** ondansetron (ZOFRAN) IV Continuous Infusions:  sodium chloride 100 mL/hr at 05/16/21 0507   methocarbamol (ROBAXIN) IV       LOS: 2 days  Time spent: Greater than 50% of the 35 minute visit was spent in counseling/coordination of care for the patient as laid out in the A&P.   Dwyane Dee, MD [facility] 05/16/2021, 2:47 PM

## 2021-05-16 NOTE — Evaluation (Signed)
Occupational Therapy Evaluation Patient Details Name: Leslie Mcknight MRN: [id number] DOB: 12-04-1934 Today's Date: 05/16/2021    History of Present Illness Pt is an 86yo woman s/p fall who sustained a L intertrochanteric fx of L femur who underwent L IM nail on 7/25. PMH: advanced dementia   Clinical Impression   Pt admitted due to fall and now s/pL IM. Pt was unable to give PLOF but per reports was completing ADLs depending on level of participation due to history of dementia. Pt was able to complete sit to stand transfer for hygiene tasks with moderate assist but cued as standing as trying push at IV pole. Pt was able to report some discomfort but unable report pain level. Pt currently with functional limitations due to the deficits listed below (see OT Problem List).  Pt will benefit from skilled OT to increase their safety and independence with ADL and functional mobility for ADL to facilitate discharge to venue listed below.      Follow Up Recommendations  SNF    Equipment Recommendations       Recommendations for Other Services       Precautions / Restrictions Precautions Precautions: Fall Precaution Comments: pt with dementia, no carry over Restrictions Weight Bearing Restrictions: Yes LLE Weight Bearing: Weight bearing as tolerated      Mobility Bed Mobility Overal bed mobility:  (presented sitting in chair) Bed Mobility: Supine to Sit     Supine to sit: Max assist;HOB elevated     General bed mobility comments: increased time, max verbal cues, maxA for trunk elevation, pt able to bring bilat LEs towards EOB, max to pivot to eob, pt able to scoot using bilat UEs with verbal cues to get feet on floor    Transfers Overall transfer level: Needs assistance Equipment used: Rolling walker (2 wheeled) Transfers: Sit to/from [facility] Sit to Stand: Mod assist Stand pivot transfers: Mod assist       General transfer comment: max cues as pt  reporting scared to stand    Balance Overall balance assessment: Needs assistance Sitting-balance support: Feet supported;Bilateral upper extremity supported Sitting balance-Leahy Scale: Fair Sitting balance - Comments: pt using bilat UEs to off weight L hip in addition to slight R lateral lean   Standing balance support: Bilateral upper extremity supported;During functional activity Standing balance-Leahy Scale: Poor Standing balance comment: dependent on external support/physical assist                           ADL either performed or assessed with clinical judgement   ADL Overall ADL's : Needs assistance/impaired Eating/Feeding: Set up;Sitting   Grooming: Minimal assistance;Cueing for safety;Cueing for sequencing;Sitting   Upper Body Bathing: Minimal assistance;Sitting;Cueing for safety;Cueing for sequencing   Lower Body Bathing: Maximal assistance;Cueing for safety;Cueing for sequencing;Sit to/from stand   Upper Body Dressing : Minimal assistance;Cueing for safety;Cueing for sequencing;Sitting   Lower Body Dressing: Maximal assistance;Cueing for safety;Cueing for sequencing;Sit to/from stand   Toilet Transfer: Moderate assistance;Cueing for safety;Cueing for sequencing;Stand-pivot;BSC   Toileting- Clothing Manipulation and Hygiene: Maximal assistance;Cueing for safety;Cueing for sequencing;Sit to/from stand;Sitting/lateral lean       Functional mobility during ADLs: Moderate assistance;Cueing for safety;Cueing for sequencing;Rolling walker (Pt only took step forwards/backwards and attempting to push out IV pole)       Vision         Perception     Praxis      Pertinent Vitals/Pain Pain Assessment: Faces Faces  Pain Scale: Hurts little more Pain Location: L hip with movement Pain Descriptors / Indicators: Grimacing;Guarding Pain Intervention(s): Limited activity within patient's tolerance     Hand Dominance Right   Extremity/Trunk Assessment  Upper Extremity Assessment Upper Extremity Assessment: Overall WFL for tasks assessed   Lower Extremity Assessment Lower Extremity Assessment: LLE deficits/detail LLE Deficits / Details: able to complete LAQ and initiate quad set, limited WBing tolerance as expected due to pain   Cervical / Trunk Assessment Cervical / Trunk Assessment: Kyphotic   Communication Communication Communication: No difficulties   Cognition Arousal/Alertness: Awake/alert Behavior During Therapy: WFL for tasks assessed/performed Overall Cognitive Status: History of cognitive impairments - at baseline (Pt had to be cued in session for saftey)                                 General Comments: pt with dementia, oriented to self and hospital   General Comments  incision covered with bandage, no drainage, SpO2 82-98% however poor waveform, pt color good and no SOB    Exercises Exercises: General Lower Extremity General Exercises - Lower Extremity Ankle Circles/Pumps: AROM;Both;10 reps;Supine Quad Sets: AROM;Left;5 reps;Supine Long Arc Quad: AROM;Both;10 reps;Seated (AA to achieve L full knee ext) Hip Flexion/Marching: AAROM;Both;10 reps;Seated   Shoulder Instructions      Home Living Family/patient expects to be discharged to:: Skilled nursing facility Living Arrangements: Alone                               Additional Comments: per neice Nevin Bloodgood the plan is for pt to go to [place] unable to care for patient      Prior Functioning/Environment Level of Independence: Needs assistance  Gait / Transfers Assistance Needed: per neice pt was amb without AD ADL's / Homemaking Assistance Needed: per neice pt was refusing to bath but would do it if she wanted to   Comments: household and short distanced community ambulator without AD        OT Problem List: Decreased strength;Decreased range of motion;Decreased activity tolerance;Impaired balance (sitting and/or  standing);Decreased safety awareness;Decreased knowledge of use of DME or AE;Pain      OT Treatment/Interventions: Self-care/ADL training;Therapeutic exercise;Therapeutic activities;Cognitive remediation/compensation;Patient/family education;Balance training    OT Goals(Current goals can be found in the care plan section) Acute Rehab OT Goals Patient Stated Goal: was unable to state OT Goal Formulation: With patient Time For Goal Achievement: 05/27/21 Potential to Achieve Goals: Good ADL Goals Pt Will Perform Upper Body Bathing: with supervision;sitting Pt Will Perform Lower Body Bathing: with supervision;sit to/from stand Pt Will Perform Toileting - Clothing Manipulation and hygiene: with min assist;sit to/from stand  OT Frequency: Min 2X/week   Barriers to D/C: Inaccessible home environment          Co-evaluation              AM-PAC OT "6 Clicks" Daily Activity     Outcome Measure Help from another person eating meals?: None Help from another person taking care of personal grooming?: A Little Help from another person toileting, which includes using toliet, bedpan, or urinal?: A Lot Help from another person bathing (including washing, rinsing, drying)?: A Lot Help from another person to put on and taking off regular upper body clothing?: A Little Help from another person to put on and taking off regular lower body clothing?: A Lot 6  Click Score: 16   End of Session Equipment Utilized During Treatment: Gait belt;Rolling walker Nurse Communication:  (IV line)  Activity Tolerance: Patient limited by pain (pt reported scared to stand) Patient left: in chair;with call bell/phone within reach  OT Visit Diagnosis: Unsteadiness on feet (R26.81);Other abnormalities of gait and mobility (R26.89);Repeated falls (R29.6);Muscle weakness (generalized) (M62.81);History of falling (Z91.81)                Time: [id number] OT Time Calculation (min): 35 min Charges:  OT General  Charges $OT Visit: 1 Visit OT Evaluation $OT Eval Low Complexity: [place] OTR/L  Acute Rehab Services  [phone] office number [phone] pager number   Joeseph Amor 05/16/2021, 9:20 AM

## 2021-05-16 NOTE — TOC Initial Note (Signed)
Transition of Care [facility]) - Initial/Assessment Note    Patient Details  Name: Leslie Mcknight MRN: [id number] Date of Birth: [birth year]  Transition of Care [facility]) CM/SW Contact:    Bethann Berkshire, [place] Phone Number: 05/16/2021, 3:05 PM  Clinical Narrative:                  Met with pt to discuss SNF. She lives in [place] alone. She states she has a son and a niece nearby. She also states her neighbors are all nearby and all she has to do is "holler" and they could hear her. CSW explained SNF recommendation but pt states she feels safe returning home. It becomes apparent that pt is not completely oriented as she thinks she is currently in [place]. She did not know what year it is and wouldn't answer when asked who the president is. She does state that she has a niece Nevin Bloodgood and a son Doren Custard both in [place] though does not know their phone numbers. CSW called and left message with Nevin Bloodgood to discuss SNF. There is no Doren Custard listed in chart.   Expected Discharge Plan: Skilled Nursing Facility Barriers to Discharge: Continued Medical Work up   Patient Goals and CMS Choice        Expected Discharge Plan and Services Expected Discharge Plan: [place]       Living arrangements for the past 2 months: Single Family Home                                      Prior Living Arrangements/Services Living arrangements for the past 2 months: Single Family Home Lives with:: Self Patient language and need for interpreter reviewed:: Yes Do you feel safe going back to the place where you live?: Yes      Need for Family Participation in Patient Care: Yes (Comment) Care giver support system in place?: No (comment)   Criminal Activity/Legal Involvement Pertinent to Current Situation/Hospitalization: No - Comment as needed  Activities of Daily Living   ADL Screening (condition at time of admission) Patient's cognitive ability adequate to safely complete daily activities?: Yes Is the  patient deaf or have difficulty hearing?: No Does the patient have difficulty seeing, even when wearing glasses/contacts?: No Does the patient have difficulty concentrating, remembering, or making decisions?: No Patient able to express need for assistance with ADLs?: Yes Does the patient have difficulty dressing or bathing?: Yes Independently performs ADLs?: No Communication: Independent Dressing (OT): Needs assistance Is this a change from baseline?: Change from baseline, expected to last >3 days Grooming: Independent Feeding: Independent Bathing: Needs assistance Is this a change from baseline?: Change from baseline, expected to last >3 days Toileting: Needs assistance Is this a change from baseline?: Change from baseline, expected to last >3days In/Out Bed: Needs assistance Is this a change from baseline?: Change from baseline, expected to last >3 days Walks in Home: Needs assistance Is this a change from baseline?: Change from baseline, expected to last >3 days Does the patient have difficulty walking or climbing stairs?: Yes Weakness of Legs: Left (Left hip fracture) Weakness of Arms/Hands: None  Permission Sought/Granted   Permission granted to share information with : Yes, Verbal Permission Granted  Share Information with NAME: Son Doren Custard and Niece Nevin Bloodgood           Emotional Assessment Appearance:: Appears younger than stated age Attitude/Demeanor/Rapport: Engaged Affect (typically observed): Happy  Orientation: : Oriented to Self Alcohol / Substance Use: Not Applicable Psych Involvement: No (comment)  Admission diagnosis:  Hip fracture ([place]) [S72.009A] Bilateral leg edema [R60.0] Closed fracture of left hip, initial encounter ([place]) [S72.002A] Fall, initial encounter [W19.XXXA] Closed comminuted intertrochanteric fracture of left femur [facility]) [S72.142A] Patient Active Problem List   Diagnosis Date Noted   Leg swelling 05/15/2021   Closed comminuted  intertrochanteric fracture of proximal end of left femur ([place]) 05/14/2021   Fall at home 05/14/2021   Hyperglycemia 05/14/2021   Leukocytosis 05/14/2021   Depressive disorder    Dementia ([place]) 08/05/2020   PCP:  Merryl Hacker No Pharmacy:   [facility], [place] [address] [place] [address] Phone: [phone] Fax: [phone]     Social Determinants of Health (SDOH) Interventions    Readmission Risk Interventions No flowsheet data found.

## 2021-05-16 NOTE — Progress Notes (Signed)
Nutrition Follow-up  DOCUMENTATION CODES:   Not applicable  INTERVENTION:   Magic cup BID with meals, each supplement provides 290 kcal and 9 grams of protein  -MVI with minerals daily  NUTRITION DIAGNOSIS:   Increased nutrient needs related to post-op healing as evidenced by estimated needs.  Ongoing  GOAL:   Patient will meet greater than or equal to 90% of their needs  Progressing   MONITOR:   PO intake, Diet advancement, Supplement acceptance, Labs, Weight trends, Skin, I & O's  REASON FOR ASSESSMENT:   Consult Assessment of nutrition requirement/status  ASSESSMENT:   Leslie Mcknight is a 86 y.o. female with medical history significant for dementia slipped at home at fell down last night and has been unable to get up since the fall. She complains of left hip pain.  She says she did not hit head. She c/o left knee pain. She denies fever and chills. She denies chest pain.  She was found by family members in the home on the floor.   7/25- s/p PROCEDURE: Intramedullary fixation, Left femur.  Reviewed I/O's: +994 ml x 24 hours and +2.2 L since admission  UOP: 600 ml x 24 hours  Case discussed with RN, who reports pt is pleasantly confused and consumed most of her breakfast. Pt consumed 100% of eggs, bacon, and milk, and 50% of English muffin.   Spoke with pt at bedside, who reports good appetite currently and PTA. She usually consumes 3 meals per day and consumes a wide variety of foods. Pt unable to provide diet history, but [place] neighbors often provide her with home cooked meals.   Pt denies any weight loss. Reviewed wt hx; pt has experienced a 2.4% wt loss over the past 9 months, which is not significant for time frame.   Noted pt with generalized fat and muscle depletions, likely secondary to advanced age.   Discussed importance of good meal and supplement intake to promote healing.   Medications reviewed and include  colace and 0.9% sodium chloride infusion @ 100 ml/hr.   Labs reviewed.   NUTRITION - FOCUSED PHYSICAL EXAM:  Flowsheet Row Most Recent Value  Orbital Region Moderate depletion  Upper Arm Region Mild depletion  Thoracic and Lumbar Region No depletion  Buccal Region No depletion  Temple Region Mild depletion  Clavicle Bone Region Mild depletion  Clavicle and Acromion Bone Region Mild depletion  Scapular Bone Region Mild depletion  Dorsal Hand Mild depletion  Patellar Region No depletion  Anterior Thigh Region No depletion  Posterior Calf Region No depletion  Edema (RD Assessment) Mild  Hair Reviewed  Eyes Reviewed  Mouth Reviewed  Skin Reviewed  Nails Reviewed       Diet Order:   Diet Order             Diet regular Room service appropriate? Yes; Fluid consistency: Thin  Diet effective now                   EDUCATION NEEDS:   No education needs have been identified at this time  Skin:  Skin Assessment: Reviewed RN Assessment  Last BM:  05/14/21  Height:   Ht Readings from Last 1 Encounters:  05/15/21 '5\' 1"'$  (1.549 m)    Weight:   Wt Readings from Last 1 Encounters:  05/14/21 52.2 kg    Ideal Body Weight:  50 kg  BMI:  Body mass index is 21.73 kg/m.  Estimated  Nutritional Needs:   Kcal:  1550-1750  Protein:  80-95 grams  Fluid:  > 1.5 L    Loistine Chance, RD, LDN, [place] Registered Dietitian II Certified Diabetes Care and Education Specialist Please refer to [facility] for RD and/or RD on-call/weekend/after hours pager

## 2021-05-17 ENCOUNTER — Encounter (HOSPITAL_COMMUNITY): Payer: Medicare Other

## 2021-05-17 DIAGNOSIS — S72142A Displaced intertrochanteric fracture of left femur, initial encounter for closed fracture: Secondary | ICD-10-CM | POA: Diagnosis not present

## 2021-05-17 LAB — CBC WITH DIFFERENTIAL/PLATELET
Abs Immature Granulocytes: 0.04 10*3/uL (ref 0.00–0.07)
Basophils Absolute: 0 10*3/uL (ref 0.0–0.1)
Basophils Relative: 1 %
Eosinophils Absolute: 0.1 10*3/uL (ref 0.0–0.5)
Eosinophils Relative: 1 %
HCT: 26.6 % — ABNORMAL LOW (ref 36.0–46.0)
Hemoglobin: 8.8 g/dL — ABNORMAL LOW (ref 12.0–15.0)
Immature Granulocytes: 1 %
Lymphocytes Relative: 14 %
Lymphs Abs: 1.1 10*3/uL (ref 0.7–4.0)
MCH: 30.6 pg (ref 26.0–34.0)
MCHC: 33.1 g/dL (ref 30.0–36.0)
MCV: 92.4 fL (ref 80.0–100.0)
Monocytes Absolute: 0.9 10*3/uL (ref 0.1–1.0)
Monocytes Relative: 11 %
Neutro Abs: 5.7 10*3/uL (ref 1.7–7.7)
Neutrophils Relative %: 72 %
Platelets: 166 10*3/uL (ref 150–400)
RBC: 2.88 MIL/uL — ABNORMAL LOW (ref 3.87–5.11)
RDW: 12.9 % (ref 11.5–15.5)
WBC: 7.8 10*3/uL (ref 4.0–10.5)
nRBC: 0 % (ref 0.0–0.2)

## 2021-05-17 LAB — MAGNESIUM: Magnesium: 1.5 mg/dL — ABNORMAL LOW (ref 1.7–2.4)

## 2021-05-17 LAB — HEMOGLOBIN A1C
Hgb A1c MFr Bld: 5.5 % (ref 4.8–5.6)
Mean Plasma Glucose: 111 mg/dL

## 2021-05-17 LAB — BASIC METABOLIC PANEL
Anion gap: 6 (ref 5–15)
BUN: 13 mg/dL (ref 8–23)
CO2: 24 mmol/L (ref 22–32)
Calcium: 7.9 mg/dL — ABNORMAL LOW (ref 8.9–10.3)
Chloride: 105 mmol/L (ref 98–111)
Creatinine, Ser: 0.71 mg/dL (ref 0.44–1.00)
GFR, Estimated: >60 mL/min (ref >60)
Glucose, Bld: 118 mg/dL — ABNORMAL HIGH (ref 70–99)
Potassium: 4.1 mmol/L (ref 3.5–5.1)
Sodium: 135 mmol/L (ref 135–145)

## 2021-05-17 NOTE — TOC Progression Note (Addendum)
Transition of Care [facility]) - Progression Note    Patient Details  Name: FRITZIE LINDH MRN: [id number] Date of Birth: 01/19/1935  Transition of Care [facility]) CM/SW Contact  Milinda Antis, LCSWA Phone Number: 05/17/2021, 10:00 AM  Clinical Narrative:    09:47-  CSW spoke with the patient's niece, Nevin Bloodgood.  The niece reported that the family would like for the patient to go to a facility named [facility] when the patient is ready to d/c.  The niece reports that the family has already secured a bed at this facility.  10:06- CSW called [facility] [phone] to confirm the information above.  There was no answer.  CSW was unable to leave a message due to the mailbox being full.  CSW will try to reach the agency again at a later time.  13:50-  CSW spoke with Juliann Pulse at [facility].  CSW was informed that the facility can accept after the patient discharges from a SNF.  The patient would need to receive rehab prior to going to the Assisted living facility at [facility].  13:56-  CSW contacted the niece Nevin Bloodgood and informed the niece of the information above.  The niece is agreed for the patient to go to a SNF for rehab.    Expected Discharge Plan: [place] Barriers to Discharge: Continued Medical Work up  Expected Discharge Plan and Services Expected Discharge Plan: [place] arrangements for the past 2 months: Single Family Home                                       Social Determinants of Health (SDOH) Interventions    Readmission Risk Interventions No flowsheet data found.

## 2021-05-17 NOTE — Care Management Important Message (Signed)
Important Message  Patient Details  Name: Leslie Mcknight MRN: [id number] Date of Birth: [birth year]   Medicare Important Message Given:  Yes     Orbie Pyo 05/17/2021, 2:16 PM

## 2021-05-17 NOTE — Progress Notes (Signed)
Physical Therapy Treatment Patient Details Name: Leslie Mcknight MRN: [id number] DOB: [birth year] Today's Date: 05/17/2021    History of Present Illness Pt is an 86yo woman s/p fall who sustained a L intertrochanteric fx of L femur who underwent L IM nail on 7/25. PMH: advanced dementia    PT Comments    Pt progressing well. Continues to have no carry over of situation but very pleasant. Began ambulation training, limited L LE WBing tolerance due to pain, as expected. Acute PT to cont to follow.    Follow Up Recommendations  SNF;Supervision/Assistance - 24 hour     Equipment Recommendations  Other (comment)    Recommendations for Other Services       Precautions / Restrictions Precautions Precautions: Fall Precaution Comments: pt with dementia, no carry over Restrictions Weight Bearing Restrictions: Yes LLE Weight Bearing: Weight bearing as tolerated    Mobility  Bed Mobility Overal bed mobility: Needs Assistance Bed Mobility: Supine to Sit     Supine to sit: Max assist;HOB elevated     General bed mobility comments: increased time, maxA for trunk and to swing LEs over EOB    Transfers Overall transfer level: Needs assistance Equipment used: Rolling walker (2 wheeled) Transfers: Sit to/from Stand Sit to Stand: Mod assist         General transfer comment: max directional verbal cues for hand placement, step by step sequence cues, resistant to WB on L LE  Ambulation/Gait Ambulation/Gait assistance: Max assist;+2 safety/equipment (RN tech pushed chair) Gait Distance (Feet): 5 Feet Assistive device: Rolling walker (2 wheeled) Gait Pattern/deviations: Step-to pattern;Decreased stride length;Decreased stance time - left;Antalgic Gait velocity: slow Gait velocity interpretation: <1.8 ft/sec, indicate of risk for recurrent falls General Gait Details: max directional step by step sequencing cues with RW, provided extra support under L arm pit to aide in off weighting  L LE to tolerate ambulation   Stairs             Wheelchair Mobility    Modified Rankin (Stroke Patients Only)       Balance Overall balance assessment: Needs assistance Sitting-balance support: Feet supported;Bilateral upper extremity supported Sitting balance-Leahy Scale: Fair Sitting balance - Comments: pt using bilat UEs to off weight L hip in addition to slight R lateral lean   Standing balance support: Bilateral upper extremity supported;During functional activity Standing balance-Leahy Scale: Poor Standing balance comment: dependent on external support/physical assist                            Cognition Arousal/Alertness: Awake/alert Behavior During Therapy: WFL for tasks assessed/performed Overall Cognitive Status: History of cognitive impairments - at baseline                                 General Comments: pt with dementia, only oriented to self at baseline, pt with no carryover of fall or her surgery, pt impulsive due to confusion and thinking she can get up but can't due to injury and pain      Exercises General Exercises - Lower Extremity Ankle Circles/Pumps: AROM;Both;Seated;15 reps Long Arc Quad: AROM;AAROM;Both;15 reps;Seated (AA on L to achieve full ROM) Hip Flexion/Marching: AAROM;Left;AROM;10 reps;Seated (L AA)    General Comments General comments (skin integrity, edema, etc.): pt with fragile skin, incision with dressing      Pertinent Vitals/Pain Pain Assessment: Faces Faces Pain Scale: Hurts little more Pain  Location: L hip with movement Pain Descriptors / Indicators: Grimacing;Guarding Pain Intervention(s): Limited activity within patient's tolerance    Home Living                      Prior Function            PT Goals (current goals can now be found in the care plan section) Progress towards PT goals: Progressing toward goals    Frequency    Min 3X/week      PT Plan Current plan  remains appropriate    Co-evaluation              AM-PAC PT "6 Clicks" Mobility   Outcome Measure  Help needed turning from your back to your side while in a flat bed without using bedrails?: A Little Help needed moving from lying on your back to sitting on the side of a flat bed without using bedrails?: A Little Help needed moving to and from a bed to a chair (including a wheelchair)?: A Little Help needed standing up from a chair using your arms (e.g., wheelchair or bedside chair)?: A Lot Help needed to walk in hospital room?: A Lot Help needed climbing 3-5 steps with a railing? : A Lot 6 Click Score: 15    End of Session Equipment Utilized During Treatment: Gait belt Activity Tolerance: Patient tolerated treatment well Patient left: in chair;with call bell/phone within reach;with chair alarm set Nurse Communication: Mobility status PT Visit Diagnosis: Unsteadiness on feet (R26.81);Muscle weakness (generalized) (M62.81);Difficulty in walking, not elsewhere classified (R26.2)     Time: [id number] PT Time Calculation (min) (ACUTE ONLY): 31 min  Charges:  $Gait Training: 8-22 mins $Therapeutic Exercise: 8-22 mins                     Kittie Plater, PT, DPT Acute Rehabilitation Services Pager #: [phone] Office #: [phone]    Berline Lopes 05/17/2021, 12:17 PM

## 2021-05-17 NOTE — NC FL2 (Signed)
[place] LEVEL OF CARE SCREENING TOOL     IDENTIFICATION  Patient Name: Leslie Mcknight Birthdate: May 06, 1935 Sex: female Admission Date (Current Location): 05/14/2021  [facility] and [place] Number:  Herbalist and Address:  [facility] [place], [place] [address] [place], [place] [address]      Provider Number: [id number]  Attending Physician Name and Address:  Dwyane Dee, MD  Relative Name and Phone Number:  Benita Stabile (Niece)   [phone]    Current Level of Care: Hospital Recommended Level of Care: [place] Prior Approval Number:    Date Approved/Denied:   PASRR Number: [id number]  Discharge Plan: SNF    Current Diagnoses: Patient Active Problem List   Diagnosis Date Noted   Leg swelling 05/15/2021   Closed comminuted intertrochanteric fracture of proximal end of left femur ([place]) 05/14/2021   Fall at home 05/14/2021   Depressive disorder    Dementia ([place]) 08/05/2020    Orientation RESPIRATION BLADDER Height & Weight     Self, Place  Normal External catheter Weight: 115 lb (52.2 kg) Height:  '5\' 1"'$  (154.9 cm)  BEHAVIORAL SYMPTOMS/MOOD NEUROLOGICAL BOWEL NUTRITION STATUS      Incontinent Diet (see d/c summary)  AMBULATORY STATUS COMMUNICATION OF NEEDS Skin   Extensive Assist Verbally Surgical wounds                       Personal Care Assistance Level of Assistance  Bathing, Feeding, Dressing Bathing Assistance: Limited assistance Feeding assistance: Independent Dressing Assistance: Maximum assistance     Functional Limitations Info  Sight, Hearing, Speech Sight Info: Adequate Hearing Info: Adequate Speech Info: Adequate    SPECIAL CARE FACTORS FREQUENCY  PT (By licensed PT), OT (By licensed OT)     PT Frequency: 5x/ week OT Frequency: 5x/ week            Contractures Contractures Info: Not present    Additional Factors Info  Code Status, Allergies Code Status Info:  Full Allergies Info: NKA           Current Medications (05/17/2021):  This is the current hospital active medication list Current Facility-Administered Medications  Medication Dose Route Frequency Provider Last Rate Last Admin   0.9 %  sodium chloride infusion   Intravenous Continuous Rod Can, MD 100 mL/hr at 05/16/21 0507 New Bag at 05/16/21 0507   acetaminophen (TYLENOL) tablet 325-650 mg  325-650 mg Oral Q6H PRN Swinteck, Aaron Edelman, MD       docusate sodium (COLACE) capsule 100 mg  100 mg Oral BID Rod Can, MD   100 mg at 05/17/21 0904   enoxaparin (LOVENOX) injection 40 mg  40 mg Subcutaneous Q24H Swinteck, Aaron Edelman, MD   40 mg at 05/17/21 [id number]   HYDROcodone-acetaminophen (NORCO) 7.5-325 MG per tablet 1-2 tablet  1-2 tablet Oral Q4H PRN Swinteck, Aaron Edelman, MD       HYDROcodone-acetaminophen (NORCO/VICODIN) 5-325 MG per tablet 1-2 tablet  1-2 tablet Oral Q4H PRN Rod Can, MD   1 tablet at 05/16/21 0426   menthol-cetylpyridinium (CEPACOL) lozenge 3 mg  1 lozenge Oral PRN Swinteck, Aaron Edelman, MD       Or   phenol (CHLORASEPTIC) mouth spray 1 spray  1 spray Mouth/Throat PRN Swinteck, Aaron Edelman, MD       methocarbamol (ROBAXIN) tablet 500 mg  500 mg Oral Q6H PRN Swinteck, Aaron Edelman, MD       Or   methocarbamol (ROBAXIN) 500 mg in dextrose 5 %  50 mL IVPB  500 mg Intravenous Q6H PRN Swinteck, Aaron Edelman, MD       metoCLOPramide (REGLAN) tablet 5-10 mg  5-10 mg Oral Q8H PRN Swinteck, Aaron Edelman, MD       Or   metoCLOPramide (REGLAN) injection 5-10 mg  5-10 mg Intravenous Q8H PRN Swinteck, Aaron Edelman, MD       morphine 2 MG/ML injection 0.5-1 mg  0.5-1 mg Intravenous Q2H PRN Swinteck, Aaron Edelman, MD       multivitamin with minerals tablet 1 tablet  1 tablet Oral Daily Dwyane Dee, MD   1 tablet at 05/17/21 0904   ondansetron (ZOFRAN) tablet 4 mg  4 mg Oral Q6H PRN Swinteck, Aaron Edelman, MD       Or   ondansetron (ZOFRAN) injection 4 mg  4 mg Intravenous Q6H PRN Rod Can, MD         Discharge  Medications: Please see discharge summary for a list of discharge medications.  Relevant Imaging Results:  Relevant Lab Results:   Additional Information SSN:  [id number], unvaccinated  Paislei Dorval F Mariella Blackwelder, LCSWA

## 2021-05-17 NOTE — Progress Notes (Signed)
Progress Note    Leslie Mcknight   [id number]  DOB: 07-12-1935  DOA: 05/14/2021     3  PCP: Pcp, No  CC: fall at home  Hospital Course: Leslie Mcknight is an 86 yo female with PMH dementia who presented after a mechanical fall at home. She noted left hip pain after falling.  On evaluation in the ER she was found to have a comminuted nondisplaced intertrochanteric proximal left femur fracture.  She was evaluated by orthopedic surgery after transfer from [facility] to [facility]. She was taken to the OR for repair on 05/15/2021.  Interval History:  No events overnight. Pleasant and comfortable this morning. Medically stable for d/c when bed found/insurance approved.   ROS: Constitutional: negative for chills, fatigue, and fevers, Respiratory: negative for cough and sputum, Cardiovascular: negative for chest pain, and Gastrointestinal: negative for abdominal pain  Assessment & Plan: * Closed comminuted intertrochanteric fracture of proximal end of left femur ([place]) - "Comminuted nondisplaced intertrochanteric proximal left femur Fracture." - s/p IM nail fixation 05/15/21 -PT/OT after surgery - DVT prophylaxis, Lovenox   Leg swelling - L>R, but overall improving now - duplex not done since admission yet - follow up LE duplex   Dementia ([place]) - continue delirium precautions  Leukocytosis-resolved as of 05/17/2021 - likely reactive s/p fall  Hyperglycemia-resolved as of 05/17/2021 - check A1c = 5.5% - likely reactive   Old records reviewed in assessment of this patient  Antimicrobials:   DVT prophylaxis: enoxaparin (LOVENOX) injection 40 mg Start: 05/16/21 0800 SCDs Start: 05/15/21 1529   Code Status:  Code Status: Full Code Family Communication:   Disposition Plan: Status is: Inpatient  Remains inpatient appropriate because:IV treatments appropriate due to intensity of illness or inability to take PO and Inpatient level of care appropriate due to severity of  illness  Dispo: The patient is from: Home              Anticipated d/c is to: SNF              Patient currently is medically stable to d/c.   Difficult to place patient No   Risk of unplanned readmission score: Unplanned Admission- Pilot do not use: 11.74   Objective: Blood pressure (!) 126/49, pulse 88, temperature 98.1 F (36.7 C), temperature source Oral, resp. rate 18, height '5\' 1"'$  (1.549 m), weight 52.2 kg, SpO2 100 %.  Examination: General appearance: alert, cooperative, and no distress Head: Normocephalic, without obvious abnormality, atraumatic Eyes:  EOMI Lungs: clear to auscultation bilaterally Heart: regular rate and rhythm and S1, S2 normal Abdomen: normal findings: bowel sounds normal and soft, non-tender Extremities: TTP in Left hip as expected; compartments soft Skin: mobility and turgor normal Neurologic: Grossly normal  Consultants:  Orthopedic surgery  Procedures:    Data Reviewed: I have personally reviewed following labs and imaging studies Results for orders placed or performed during the hospital encounter of 05/14/21 (from the past 24 hour(s))  Basic metabolic panel     Status: Abnormal   Collection Time: 05/17/21  2:02 AM  Result Value Ref Range   Sodium 135 135 - 145 mmol/L   Potassium 4.1 3.5 - 5.1 mmol/L   Chloride 105 98 - 111 mmol/L   CO2 24 22 - 32 mmol/L   Glucose, Bld 118 (H) 70 - 99 mg/dL   BUN 13 8 - 23 mg/dL   Creatinine, Ser 0.71 0.44 - 1.00 mg/dL   Calcium 7.9 (L) 8.9 - 10.3 mg/dL  GFR, Estimated >60 >60 mL/min   Anion gap 6 5 - 15  CBC with Differential/Platelet     Status: Abnormal   Collection Time: 05/17/21  2:02 AM  Result Value Ref Range   WBC 7.8 4.0 - 10.5 K/uL   RBC 2.88 (L) 3.87 - 5.11 MIL/uL   Hemoglobin 8.8 (L) 12.0 - 15.0 g/dL   HCT 26.6 (L) 36.0 - 46.0 %   MCV 92.4 80.0 - 100.0 fL   MCH 30.6 26.0 - 34.0 pg   MCHC 33.1 30.0 - 36.0 g/dL   RDW 12.9 11.5 - 15.5 %   Platelets 166 150 - 400 K/uL   nRBC 0.0 0.0 -  0.2 %   Neutrophils Relative % 72 %   Neutro Abs 5.7 1.7 - 7.7 K/uL   Lymphocytes Relative 14 %   Lymphs Abs 1.1 0.7 - 4.0 K/uL   Monocytes Relative 11 %   Monocytes Absolute 0.9 0.1 - 1.0 K/uL   Eosinophils Relative 1 %   Eosinophils Absolute 0.1 0.0 - 0.5 K/uL   Basophils Relative 1 %   Basophils Absolute 0.0 0.0 - 0.1 K/uL   Immature Granulocytes 1 %   Abs Immature Granulocytes 0.04 0.00 - 0.07 K/uL  Magnesium     Status: Abnormal   Collection Time: 05/17/21  2:02 AM  Result Value Ref Range   Magnesium 1.5 (L) 1.7 - 2.4 mg/dL    Recent Results (from the past 240 hour(s))  Resp Panel by RT-PCR (Flu A&B, Covid) Nasopharyngeal Swab     Status: None   Collection Time: 05/14/21  9:28 AM   Specimen: Nasopharyngeal Swab; Nasopharyngeal(NP) swabs in vial transport medium  Result Value Ref Range Status   SARS Coronavirus 2 by RT PCR NEGATIVE NEGATIVE Final    Comment: (NOTE) SARS-CoV-2 target nucleic acids are NOT DETECTED.  The SARS-CoV-2 RNA is generally detectable in upper respiratory specimens during the acute phase of infection. The lowest concentration of SARS-CoV-2 viral copies this assay can detect is 138 copies/mL. A negative result does not preclude SARS-Cov-2 infection and should not be used as the sole basis for treatment or other patient management decisions. A negative result may occur with  improper specimen collection/handling, submission of specimen other than nasopharyngeal swab, presence of viral mutation(s) within the areas targeted by this assay, and inadequate number of viral copies(<138 copies/mL). A negative result must be combined with clinical observations, patient history, and epidemiological information. The expected result is Negative.  Fact Sheet for Patients:  EntrepreneurPulse.com.au  Fact Sheet for Healthcare Providers:  IncredibleEmployment.be  This test is no t yet approved or cleared by the [place]  FDA and  has been authorized for detection and/or diagnosis of SARS-CoV-2 by FDA under an Emergency Use Authorization (EUA). This EUA will remain  in effect (meaning this test can be used) for the duration of the COVID-19 declaration under Section 564(b)(1) of the Act, 21 U.S.C.section 360bbb-3(b)(1), unless the authorization is terminated  or revoked sooner.       Influenza A by PCR NEGATIVE NEGATIVE Final   Influenza B by PCR NEGATIVE NEGATIVE Final    Comment: (NOTE) The Xpert Xpress SARS-CoV-2/FLU/RSV plus assay is intended as an aid in the diagnosis of influenza from Nasopharyngeal swab specimens and should not be used as a sole basis for treatment. Nasal washings and aspirates are unacceptable for Xpert Xpress SARS-CoV-2/FLU/RSV testing.  Fact Sheet for Patients: EntrepreneurPulse.com.au  Fact Sheet for Healthcare Providers: IncredibleEmployment.be  This test is not  yet approved or cleared by the [place] and has been authorized for detection and/or diagnosis of SARS-CoV-2 by FDA under an Emergency Use Authorization (EUA). This EUA will remain in effect (meaning this test can be used) for the duration of the COVID-19 declaration under Section 564(b)(1) of the Act, 21 U.S.C. section 360bbb-3(b)(1), unless the authorization is terminated or revoked.  Performed at [facility], [address] [place], [place] [address]   Surgical pcr screen     Status: None   Collection Time: 05/15/21  5:06 AM   Specimen: Nasal Mucosa; Nasal Swab  Result Value Ref Range Status   MRSA, PCR NEGATIVE NEGATIVE Final   Staphylococcus aureus NEGATIVE NEGATIVE Final    Comment: (NOTE) The Xpert SA Assay (FDA approved for NASAL specimens in patients 41 years of age and older), is one component of a comprehensive surveillance program. It is not intended to diagnose infection nor to guide or monitor treatment. Performed at [facility],  [place] [address] [place], [place] [address]      Radiology Studies: Pelvis Portable  Result Date: 05/15/2021 CLINICAL DATA:  Left hip ORIF EXAM: PORTABLE PELVIS 1-2 VIEWS COMPARISON:  05/14/2021 FINDINGS: Interval postsurgical change of left femur ORIF with intramedullary rod and screw fixation. Fracture alignment is improved. Persistent mild displacement of the lesser trochanteric fracture fragment. Hip alignment is maintained without dislocation. Bones are demineralized. Expected postoperative changes within the overlying soft tissues. IMPRESSION: Interval postsurgical change of left femur ORIF with improved fracture alignment. Electronically Signed   By: Davina Poke D.O.   On: 05/15/2021 17:06   DG C-Arm 1-60 Min  Result Date: 05/15/2021 CLINICAL DATA:  Intramedullary nail. Intratrochanteric left femur fracture. EXAM: DG C-ARM 1-60 MIN; LEFT FEMUR 2 VIEWS FLUOROSCOPY TIME:  Fluoroscopy Time:  51 seconds. Radiation Exposure Index (if provided by the fluoroscopic device): 5.05 mGy. Number of Acquired Spot Images: 3 COMPARISON:  May 14, 2021. FINDINGS: Three C-arm fluoroscopic images were obtained intraoperatively and submitted for post operative interpretation. These images demonstrate intramedullary nail and screw fixation of an intertrochanteric femur fracture. Alignment is improved, near anatomic. Please see the performing provider's procedural report for further detail. IMPRESSION: Intraoperative fluoroscopy, as detailed above. Electronically Signed   By: Margaretha Sheffield MD   On: 05/15/2021 14:11   DG FEMUR MIN 2 VIEWS LEFT  Result Date: 05/15/2021 CLINICAL DATA:  Intramedullary nail. Intratrochanteric left femur fracture. EXAM: DG C-ARM 1-60 MIN; LEFT FEMUR 2 VIEWS FLUOROSCOPY TIME:  Fluoroscopy Time:  51 seconds. Radiation Exposure Index (if provided by the fluoroscopic device): 5.05 mGy. Number of Acquired Spot Images: 3 COMPARISON:  May 14, 2021. FINDINGS: Three C-arm fluoroscopic images  were obtained intraoperatively and submitted for post operative interpretation. These images demonstrate intramedullary nail and screw fixation of an intertrochanteric femur fracture. Alignment is improved, near anatomic. Please see the performing provider's procedural report for further detail. IMPRESSION: Intraoperative fluoroscopy, as detailed above. Electronically Signed   By: Margaretha Sheffield MD   On: 05/15/2021 14:11   Pelvis Portable  Final Result    DG FEMUR MIN 2 VIEWS LEFT  Final Result    DG C-Arm 1-60 Min  Final Result    DG Knee Left Port  Final Result    DG Hip Unilat With Pelvis 2-3 Views Left  Final Result    US Venous Img Lower Bilateral (DVT)    (Results Pending)    Scheduled Meds:  docusate sodium  100 mg Oral BID  enoxaparin (LOVENOX) injection  40 mg Subcutaneous Q24H   multivitamin with minerals  1 tablet Oral Daily   PRN Meds: acetaminophen, HYDROcodone-acetaminophen, HYDROcodone-acetaminophen, menthol-cetylpyridinium **OR** phenol, methocarbamol **OR** methocarbamol (ROBAXIN) IV, metoCLOPramide **OR** metoCLOPramide (REGLAN) injection, morphine injection, ondansetron **OR** ondansetron (ZOFRAN) IV Continuous Infusions:  sodium chloride 100 mL/hr at 05/16/21 0507   methocarbamol (ROBAXIN) IV       LOS: 3 days  Time spent: Greater than 50% of the 35 minute visit was spent in counseling/coordination of care for the patient as laid out in the A&P.   Dwyane Dee, MD [facility] 05/17/2021, 12:31 PM

## 2021-05-18 ENCOUNTER — Inpatient Hospital Stay (HOSPITAL_COMMUNITY): Payer: Medicare Other

## 2021-05-18 DIAGNOSIS — R609 Edema, unspecified: Secondary | ICD-10-CM | POA: Diagnosis not present

## 2021-05-18 DIAGNOSIS — M7989 Other specified soft tissue disorders: Secondary | ICD-10-CM

## 2021-05-18 DIAGNOSIS — S72142G Displaced intertrochanteric fracture of left femur, subsequent encounter for closed fracture with delayed healing: Secondary | ICD-10-CM

## 2021-05-18 DIAGNOSIS — F0390 Unspecified dementia without behavioral disturbance: Secondary | ICD-10-CM

## 2021-05-18 LAB — CBC WITH DIFFERENTIAL/PLATELET
Abs Immature Granulocytes: 0.03 10*3/uL (ref 0.00–0.07)
Basophils Absolute: 0.1 10*3/uL (ref 0.0–0.1)
Basophils Relative: 1 %
Eosinophils Absolute: 0.2 10*3/uL (ref 0.0–0.5)
Eosinophils Relative: 2 %
HCT: 25.7 % — ABNORMAL LOW (ref 36.0–46.0)
Hemoglobin: 8.4 g/dL — ABNORMAL LOW (ref 12.0–15.0)
Immature Granulocytes: 0 %
Lymphocytes Relative: 19 %
Lymphs Abs: 1.3 10*3/uL (ref 0.7–4.0)
MCH: 30.4 pg (ref 26.0–34.0)
MCHC: 32.7 g/dL (ref 30.0–36.0)
MCV: 93.1 fL (ref 80.0–100.0)
Monocytes Absolute: 0.8 10*3/uL (ref 0.1–1.0)
Monocytes Relative: 12 %
Neutro Abs: 4.5 10*3/uL (ref 1.7–7.7)
Neutrophils Relative %: 66 %
Platelets: 177 10*3/uL (ref 150–400)
RBC: 2.76 MIL/uL — ABNORMAL LOW (ref 3.87–5.11)
RDW: 13.1 % (ref 11.5–15.5)
WBC: 6.9 10*3/uL (ref 4.0–10.5)
nRBC: 0 % (ref 0.0–0.2)

## 2021-05-18 LAB — BASIC METABOLIC PANEL
Anion gap: 3 — ABNORMAL LOW (ref 5–15)
BUN: 13 mg/dL (ref 8–23)
CO2: 28 mmol/L (ref 22–32)
Calcium: 7.7 mg/dL — ABNORMAL LOW (ref 8.9–10.3)
Chloride: 103 mmol/L (ref 98–111)
Creatinine, Ser: 0.72 mg/dL (ref 0.44–1.00)
GFR, Estimated: >60 mL/min (ref >60)
Glucose, Bld: 109 mg/dL — ABNORMAL HIGH (ref 70–99)
Potassium: 4.1 mmol/L (ref 3.5–5.1)
Sodium: 134 mmol/L — ABNORMAL LOW (ref 135–145)

## 2021-05-18 LAB — MAGNESIUM: Magnesium: 2 mg/dL (ref 1.7–2.4)

## 2021-05-18 MED ORDER — DOCUSATE SODIUM 100 MG PO CAPS: 100.0000 mg | ORAL_CAPSULE | Freq: Two times a day (BID) | ORAL | 0 refills | Status: AC

## 2021-05-18 MED ORDER — ENOXAPARIN SODIUM 40 MG/0.4ML IJ SOSY: 40.0000 mg | PREFILLED_SYRINGE | INTRAMUSCULAR | 0 refills | Status: DC

## 2021-05-18 MED ORDER — HYDROCODONE-ACETAMINOPHEN 5-325 MG PO TABS: 1.0000 | ORAL_TABLET | ORAL | 0 refills | Status: DC | PRN

## 2021-05-18 NOTE — Plan of Care (Signed)

## 2021-05-18 NOTE — Progress Notes (Signed)
Bilateral lower extremity venous duplex has been completed. Preliminary results can be found in CV Proc through chart review.   05/18/21 1:01 PM Carlos Levering RVT

## 2021-05-18 NOTE — TOC Progression Note (Signed)
Transition of Care [facility]) - Progression Note    Patient Details  Name: KEISHAWN GULDEN MRN: [id number] Date of Birth: 1935-04-11  [facility] CM/SW [place], LCSWA Phone Number: 05/18/2021, 10:49 AM  Clinical Narrative:    10:40-  CSW spoke with the patient's niece.  The family would like for the patient to go to [place].  CSW called [facility] to inquire about bed availability.  There was no answer.  CSW left a VM requesting a returned call.     Expected Discharge Plan: [place] Barriers to Discharge: Continued Medical Work up  Expected Discharge Plan and Services Expected Discharge Plan: [place] arrangements for the past 2 months: Single Family Home Expected Discharge Date: 05/18/21                                     Social Determinants of Health (SDOH) Interventions    Readmission Risk Interventions No flowsheet data found.

## 2021-05-18 NOTE — TOC Transition Note (Signed)
Transition of Care [facility]) - CM/SW Discharge Note   Patient Details  Name: Leslie Mcknight MRN: [id number] Date of Birth: 29-May-1935  Transition of Care [facility]) CM/SW Contact:  Milinda Antis, [place] Phone Number: 05/18/2021, 12:53 PM   Clinical Narrative:    Patient will DC to: [facility] Anticipated DC date: 05/18/2021 Family notified: Yes Transport by: Corey [name]   Per MD patient ready for DC to SNF . RN to call report prior to discharge [phone]. RN, patient, patient's family, and facility notified of DC. Discharge Summary and FL2 sent to facility. DC packet on chart. Ambulance transport will be requested for patient when RN reports patient is ready.   CSW will sign off for now as social work intervention is no longer needed. Please consult [place] again if new needs arise.     Final next level of care: Skilled Nursing Facility Barriers to Discharge: Barriers Resolved   Patient Goals and CMS Choice        Discharge Placement              Patient chooses bed at:  [facility]) Patient to be transferred to facility by: [place] Name of family member notified: Benita Stabile (Niece)   [phone] Patient and family notified of of transfer: 05/18/21  Discharge Plan and Services                                     Social Determinants of Health (SDOH) Interventions     Readmission Risk Interventions No flowsheet data found.

## 2021-05-18 NOTE — Progress Notes (Signed)
Pt report given to Programmer, systems for [facility] [place] [facility].PTAR on the way for transport.Pt.'s  legal guardian Benita Stabile also notified.

## 2021-05-18 NOTE — Progress Notes (Signed)
Physical Therapy Treatment Patient Details Name: Leslie Mcknight MRN: [id number] DOB: 07-01-1935 Today's Date: 05/18/2021    History of Present Illness 86 yo woman s/p fall who sustained a L intertrochanteric fx of L femur who underwent L IM nail on 7/25. PMH: advanced dementia    PT Comments    Pt was assisted to do ROM to BLE's and then was repositioned on the bed to be transported by PTAR to SNF.  Pt is confused but cooperative, and should be able to make progress in SNF but will require a lot of safety and supervision provisions.  Follow along if DC is held for some reason.   Follow Up Recommendations  SNF     Equipment Recommendations  None recommended by PT (will decide in SNF)    Recommendations for Other Services       Precautions / Restrictions Precautions Precautions: Fall Precaution Comments: dementia Restrictions Weight Bearing Restrictions: Yes LLE Weight Bearing: Weight bearing as tolerated    Mobility  Bed Mobility Overal bed mobility: Needs Assistance             General bed mobility comments: declines to move off bed    Transfers                 General transfer comment: declined  Ambulation/Gait                 Stairs             Wheelchair Mobility    Modified Rankin (Stroke Patients Only)       Balance                                            Cognition Arousal/Alertness: Awake/alert Behavior During Therapy: WFL for tasks assessed/performed Overall Cognitive Status: History of cognitive impairments - at baseline                                        Exercises General Exercises - Lower Extremity Ankle Circles/Pumps: AAROM;5 reps Quad Sets: AROM;10 reps Gluteal Sets: AROM;10 reps Heel Slides: AAROM;10 reps Hip ABduction/ADduction: AAROM;10 reps Straight Leg Raises: AAROM;10 reps    General Comments        Pertinent Vitals/Pain Pain Assessment: Faces Faces Pain  Scale: Hurts little more Pain Location: L hip with movement Pain Descriptors / Indicators: Grimacing;Guarding;Operative site guarding Pain Intervention(s): Limited activity within patient's tolerance;Monitored during session;Premedicated before session;Repositioned;RN gave pain meds during session    Home Living                      Prior Function            PT Goals (current goals can now be found in the care plan section) Acute Rehab PT Goals Patient Stated Goal: to go home Progress towards PT goals: Not progressing toward goals - comment    Frequency    Min 3X/week      PT Plan Current plan remains appropriate    Co-evaluation              AM-PAC PT "6 Clicks" Mobility   Outcome Measure  Help needed turning from your back to your side while in a flat bed without using bedrails?: A Little Help needed moving  from lying on your back to sitting on the side of a flat bed without using bedrails?: A Little Help needed moving to and from a bed to a chair (including a wheelchair)?: A Little Help needed standing up from a chair using your arms (e.g., wheelchair or bedside chair)?: A Lot Help needed to walk in hospital room?: A Lot Help needed climbing 3-5 steps with a railing? : A Lot 6 Click Score: 15    End of Session   Activity Tolerance: Patient tolerated treatment well Patient left: in bed;with call bell/phone within reach;with bed alarm set;with nursing/sitter in room Nurse Communication: Mobility status PT Visit Diagnosis: Unsteadiness on feet (R26.81);Muscle weakness (generalized) (M62.81);Difficulty in walking, not elsewhere classified (R26.2)     Time: [id number] PT Time Calculation (min) (ACUTE ONLY): 25 min  Charges:  $Therapeutic Exercise: 23-37 mins                  Ramond Dial 05/18/2021, 3:35 PM  Mee Hives, PT MS Acute Rehab Dept. Number: [place] and [place]

## 2021-05-18 NOTE — Discharge Summary (Signed)
Physician Discharge Summary  SHERRYLL DESA [id number] DOB: 1935-01-12 DOA: 05/14/2021  PCP: Pcp, No  Admit date: 05/14/2021 Discharge date: 05/18/2021  Admitted From: Home Disposition: SNF  Recommendations for Outpatient Follow-up:  Follow up with PCP in 1-2 weeks Please obtain BMP/CBC in one week your next doctors visit.  Follow-up outpatient orthopedic in 2 weeks for suture removal Lovenox for DVT prophylaxis   Discharge Condition: Stable CODE STATUS: Full code Diet recommendation: Regular  Brief/Interim Summary: 86 year old with past medical history of dementia who presented after mechanical fall with left hip pain.  Found to have comminuted nondisplaced fracture.  Underwent IM nail fixation 7/25.  Postop she did well.  PT/OT recommended SNF therefore arrangements were made.  Rest of the recommendations as stated above.  She has not really use any narcotic pain medications during the hospital stay.  Body mass index is 21.73 kg/m.      Closed comminuted intertrochanteric fracture of proximal end of left femur (HCC) Status post IM nailing 7/25.  Postop PT/OT recommended SNF therefore arrangements made.  DVT prophylaxis Lovenox   Dementia ([place]) - continue delirium precautions   Leukocytosis-resolved as of 05/17/2021 - likely reactive s/p fall.  Now resolved   Hyperglycemia-resolved as of 05/17/2021 - check A1c = 5.5% - likely reactive     Discharge Diagnoses:  Principal Problem:   Closed comminuted intertrochanteric fracture of proximal end of left femur ([place]) Active Problems:   Dementia ([place])   Leg swelling      Consultations: Orthopedic  Subjective: No complaints doing well  Discharge Exam: Vitals:   05/18/21 0743 05/18/21 1207  BP: 131/60 103/81  Pulse: 80 88  Resp: 16   Temp: 98.5 F (36.9 C) 98.7 F (37.1 C)  SpO2: 99% (!) 83%   Vitals:   05/17/21 1641 05/17/21 2200 05/18/21 0743 05/18/21 1207  BP: 108/81 130/64 131/60 103/81  Pulse: (!)  102 98 80 88  Resp: 17  16   Temp: 97.8 F (36.6 C) 99 F (37.2 C) 98.5 F (36.9 C) 98.7 F (37.1 C)  TempSrc:   Oral Oral  SpO2:  98% 99% (!) 83%  Weight:      Height:        General: Pt is alert, awake, not in acute distress Cardiovascular: RRR, S1/S2 +, no rubs, no gallops Respiratory: CTA bilaterally, no wheezing, no rhonchi Abdominal: Soft, NT, ND, bowel sounds + Extremities: no edema, no cyanosis  Discharge Instructions   Allergies as of 05/18/2021   No Known Allergies      Medication List     STOP taking these medications    nitrofurantoin 100 MG capsule Commonly known as: MACRODANTIN       TAKE these medications    enoxaparin 40 MG/0.4ML injection Commonly known as: LOVENOX Inject 0.4 mLs (40 mg total) into the skin daily for 21 days. Start taking on: May 19, 2021        Follow-up Information     Swinteck, Aaron Edelman, MD. Schedule an appointment as soon as possible for a visit in 2 week(s).   Specialty: Orthopedic Surgery Why: For wound re-check, For suture removal Contact information: [address] [place] [address] [phone]                No Known Allergies  You were cared for by a hospitalist during your hospital stay. If you have any questions about your discharge medications or the care you received while you were in the hospital after  you are discharged, you can call the unit and asked to speak with the hospitalist on call if the hospitalist that took care of you is not available. Once you are discharged, your primary care physician will handle any further medical issues. Please note that no refills for any discharge medications will be authorized once you are discharged, as it is imperative that you return to your primary care physician (or establish a relationship with a primary care physician if you do not have one) for your aftercare needs so that they can reassess your need for medications and monitor your lab  values.   Procedures/Studies: Pelvis Portable  Result Date: 05/15/2021 CLINICAL DATA:  Left hip ORIF EXAM: PORTABLE PELVIS 1-2 VIEWS COMPARISON:  05/14/2021 FINDINGS: Interval postsurgical change of left femur ORIF with intramedullary rod and screw fixation. Fracture alignment is improved. Persistent mild displacement of the lesser trochanteric fracture fragment. Hip alignment is maintained without dislocation. Bones are demineralized. Expected postoperative changes within the overlying soft tissues. IMPRESSION: Interval postsurgical change of left femur ORIF with improved fracture alignment. Electronically Signed   By: Davina Poke D.O.   On: 05/15/2021 17:06   DG Knee Left Port  Result Date: 05/14/2021 CLINICAL DATA:  Fall. EXAM: PORTABLE LEFT KNEE - 1-2 VIEW COMPARISON:  None. FINDINGS: No evidence of fracture, dislocation, or joint effusion. Moderate narrowing of medial joint space is noted. Mild narrowing of lateral joint space is noted. Soft tissues are unremarkable. IMPRESSION: Moderate degenerative joint disease.  No acute abnormality seen. Electronically Signed   By: Marijo Conception M.D.   On: 05/14/2021 13:51   DG C-Arm 1-60 Min  Result Date: 05/15/2021 CLINICAL DATA:  Intramedullary nail. Intratrochanteric left femur fracture. EXAM: DG C-ARM 1-60 MIN; LEFT FEMUR 2 VIEWS FLUOROSCOPY TIME:  Fluoroscopy Time:  51 seconds. Radiation Exposure Index (if provided by the fluoroscopic device): 5.05 mGy. Number of Acquired Spot Images: 3 COMPARISON:  May 14, 2021. FINDINGS: Three C-arm fluoroscopic images were obtained intraoperatively and submitted for post operative interpretation. These images demonstrate intramedullary nail and screw fixation of an intertrochanteric femur fracture. Alignment is improved, near anatomic. Please see the performing provider's procedural report for further detail. IMPRESSION: Intraoperative fluoroscopy, as detailed above. Electronically Signed   By: Margaretha Sheffield MD   On: 05/15/2021 14:11   DG Hip Unilat With Pelvis 2-3 Views Left  Result Date: 05/14/2021 CLINICAL DATA:  Fall this morning with left hip pain with deformity noted EXAM: DG HIP (WITH OR WITHOUT PELVIS) 2-3V LEFT COMPARISON:  None. FINDINGS: Comminuted nondisplaced intertrochanteric proximal left femur fracture. No hip dislocation. No pelvic diastasis. Surgical clips throughout the right greater than left pelvis. Degenerative changes in the visualized lower lumbar spine. IMPRESSION: Comminuted nondisplaced intertrochanteric proximal left femur fracture. Electronically Signed   By: Ilona Sorrel M.D.   On: 05/14/2021 10:39   DG FEMUR MIN 2 VIEWS LEFT  Result Date: 05/15/2021 CLINICAL DATA:  Intramedullary nail. Intratrochanteric left femur fracture. EXAM: DG C-ARM 1-60 MIN; LEFT FEMUR 2 VIEWS FLUOROSCOPY TIME:  Fluoroscopy Time:  51 seconds. Radiation Exposure Index (if provided by the fluoroscopic device): 5.05 mGy. Number of Acquired Spot Images: 3 COMPARISON:  May 14, 2021. FINDINGS: Three C-arm fluoroscopic images were obtained intraoperatively and submitted for post operative interpretation. These images demonstrate intramedullary nail and screw fixation of an intertrochanteric femur fracture. Alignment is improved, near anatomic. Please see the performing provider's procedural report for further detail. IMPRESSION: Intraoperative fluoroscopy, as detailed above. Electronically Signed  By: Margaretha Sheffield MD   On: 05/15/2021 14:11     The results of significant diagnostics from this hospitalization (including imaging, microbiology, ancillary and laboratory) are listed below for reference.     Microbiology: Recent Results (from the past 240 hour(s))  Resp Panel by RT-PCR (Flu A&B, Covid) Nasopharyngeal Swab     Status: None   Collection Time: 05/14/21  9:28 AM   Specimen: Nasopharyngeal Swab; Nasopharyngeal(NP) swabs in vial transport medium  Result Value Ref Range Status   SARS  Coronavirus 2 by RT PCR NEGATIVE NEGATIVE Final    Comment: (NOTE) SARS-CoV-2 target nucleic acids are NOT DETECTED.  The SARS-CoV-2 RNA is generally detectable in upper respiratory specimens during the acute phase of infection. The lowest concentration of SARS-CoV-2 viral copies this assay can detect is 138 copies/mL. A negative result does not preclude SARS-Cov-2 infection and should not be used as the sole basis for treatment or other patient management decisions. A negative result may occur with  improper specimen collection/handling, submission of specimen other than nasopharyngeal swab, presence of viral mutation(s) within the areas targeted by this assay, and inadequate number of viral copies(<138 copies/mL). A negative result must be combined with clinical observations, patient history, and epidemiological information. The expected result is Negative.  Fact Sheet for Patients:  EntrepreneurPulse.com.au  Fact Sheet for Healthcare Providers:  IncredibleEmployment.be  This test is no t yet approved or cleared by the [place] FDA and  has been authorized for detection and/or diagnosis of SARS-CoV-2 by FDA under an Emergency Use Authorization (EUA). This EUA will remain  in effect (meaning this test can be used) for the duration of the COVID-19 declaration under Section 564(b)(1) of the Act, 21 U.S.C.section 360bbb-3(b)(1), unless the authorization is terminated  or revoked sooner.       Influenza A by PCR NEGATIVE NEGATIVE Final   Influenza B by PCR NEGATIVE NEGATIVE Final    Comment: (NOTE) The Xpert Xpress SARS-CoV-2/FLU/RSV plus assay is intended as an aid in the diagnosis of influenza from Nasopharyngeal swab specimens and should not be used as a sole basis for treatment. Nasal washings and aspirates are unacceptable for Xpert Xpress SARS-CoV-2/FLU/RSV testing.  Fact Sheet for  Patients: EntrepreneurPulse.com.au  Fact Sheet for Healthcare Providers: IncredibleEmployment.be  This test is not yet approved or cleared by the [place] FDA and has been authorized for detection and/or diagnosis of SARS-CoV-2 by FDA under an Emergency Use Authorization (EUA). This EUA will remain in effect (meaning this test can be used) for the duration of the COVID-19 declaration under Section 564(b)(1) of the Act, 21 U.S.C. section 360bbb-3(b)(1), unless the authorization is terminated or revoked.  Performed at [facility], [address], [place], [place] [address]   Surgical pcr screen     Status: None   Collection Time: 05/15/21  5:06 AM   Specimen: Nasal Mucosa; Nasal Swab  Result Value Ref Range Status   MRSA, PCR NEGATIVE NEGATIVE Final   Staphylococcus aureus NEGATIVE NEGATIVE Final    Comment: (NOTE) The Xpert SA Assay (FDA approved for NASAL specimens in patients 15 years of age and older), is one component of a comprehensive surveillance program. It is not intended to diagnose infection nor to guide or monitor treatment. Performed at [facility], [place] [address], [place], [place] [address]      Labs: BNP (last 3 results) No results for input(s): BNP in the last 8760 hours. Basic Metabolic Panel: Recent Labs  Lab 05/14/21 [phone]  05/15/21 0147 05/15/21 1606 05/16/21 0349 05/17/21 0202 05/18/21 0134  NA 139 136  --  135 135 134*  K 4.3 3.7  --  3.8 4.1 4.1  CL 106 107  --  105 105 103  CO2 26 22  --  '23 24 28  '$ GLUCOSE 140* 99  --  115* 118* 109*  BUN 12 7*  --  '8 13 13  '$ CREATININE 0.73 0.82 0.82 0.86 0.71 0.72  CALCIUM 8.5* 7.8*  --  7.7* 7.9* 7.7*  MG  --   --   --   --  1.5* 2.0   Liver Function Tests: Recent Labs  Lab 05/14/21 0944  AST 25  ALT 14  ALKPHOS 80  BILITOT 1.0  PROT 7.7  ALBUMIN 3.5   No results for input(s): LIPASE, AMYLASE in the last 168 hours. No results for input(s): AMMONIA  in the last 168 hours. CBC: Recent Labs  Lab 05/14/21 0944 05/15/21 0147 05/15/21 1606 05/16/21 0349 05/17/21 0202 05/18/21 0134  WBC 13.6* 8.9 15.6* 9.0 7.8 6.9  NEUTROABS 11.9*  --   --  7.2 5.7 4.5  HGB 13.7 11.2* 11.5* 9.5* 8.8* 8.4*  HCT 44.0 35.4* 35.5* 29.7* 26.6* 25.7*  MCV 97.6 94.9 94.7 93.1 92.4 93.1  PLT 280 238 226 194 166 177   Cardiac Enzymes: Recent Labs  Lab 05/14/21 0944  CKTOTAL 202   BNP: Invalid input(s): POCBNP CBG: No results for input(s): GLUCAP in the last 168 hours. D-Dimer No results for input(s): DDIMER in the last 72 hours. Hgb A1c Recent Labs    05/16/21 0349  HGBA1C 5.5   Lipid Profile No results for input(s): CHOL, HDL, LDLCALC, TRIG, CHOLHDL, LDLDIRECT in the last 72 hours. Thyroid function studies No results for input(s): TSH, T4TOTAL, T3FREE, THYROIDAB in the last 72 hours.  Invalid input(s): FREET3 Anemia work up No results for input(s): VITAMINB12, FOLATE, FERRITIN, TIBC, IRON, RETICCTPCT in the last 72 hours. Urinalysis    Component Value Date/Time   COLORURINE STRAW (A) 05/14/2021 0930   APPEARANCEUR CLEAR 05/14/2021 0930   LABSPEC 1.008 05/14/2021 0930   PHURINE 9.0 (H) 05/14/2021 0930   GLUCOSEU NEGATIVE 05/14/2021 0930   HGBUR SMALL (A) 05/14/2021 0930   BILIRUBINUR NEGATIVE 05/14/2021 0930   KETONESUR 5 (A) 05/14/2021 0930   PROTEINUR NEGATIVE 05/14/2021 0930   NITRITE NEGATIVE 05/14/2021 0930   LEUKOCYTESUR NEGATIVE 05/14/2021 0930   Sepsis Labs Invalid input(s): PROCALCITONIN,  WBC,  LACTICIDVEN Microbiology Recent Results (from the past 240 hour(s))  Resp Panel by RT-PCR (Flu A&B, Covid) Nasopharyngeal Swab     Status: None   Collection Time: 05/14/21  9:28 AM   Specimen: Nasopharyngeal Swab; Nasopharyngeal(NP) swabs in vial transport medium  Result Value Ref Range Status   SARS Coronavirus 2 by RT PCR NEGATIVE NEGATIVE Final    Comment: (NOTE) SARS-CoV-2 target nucleic acids are NOT DETECTED.  The  SARS-CoV-2 RNA is generally detectable in upper respiratory specimens during the acute phase of infection. The lowest concentration of SARS-CoV-2 viral copies this assay can detect is 138 copies/mL. A negative result does not preclude SARS-Cov-2 infection and should not be used as the sole basis for treatment or other patient management decisions. A negative result may occur with  improper specimen collection/handling, submission of specimen other than nasopharyngeal swab, presence of viral mutation(s) within the areas targeted by this assay, and inadequate number of viral copies(<138 copies/mL). A negative result must be combined with clinical observations, patient history, and epidemiological  information. The expected result is Negative.  Fact Sheet for Patients:  EntrepreneurPulse.com.au  Fact Sheet for Healthcare Providers:  IncredibleEmployment.be  This test is no t yet approved or cleared by the [place] FDA and  has been authorized for detection and/or diagnosis of SARS-CoV-2 by FDA under an Emergency Use Authorization (EUA). This EUA will remain  in effect (meaning this test can be used) for the duration of the COVID-19 declaration under Section 564(b)(1) of the Act, 21 U.S.C.section 360bbb-3(b)(1), unless the authorization is terminated  or revoked sooner.       Influenza A by PCR NEGATIVE NEGATIVE Final   Influenza B by PCR NEGATIVE NEGATIVE Final    Comment: (NOTE) The Xpert Xpress SARS-CoV-2/FLU/RSV plus assay is intended as an aid in the diagnosis of influenza from Nasopharyngeal swab specimens and should not be used as a sole basis for treatment. Nasal washings and aspirates are unacceptable for Xpert Xpress SARS-CoV-2/FLU/RSV testing.  Fact Sheet for Patients: EntrepreneurPulse.com.au  Fact Sheet for Healthcare Providers: IncredibleEmployment.be  This test is not yet approved or  cleared by the [place] FDA and has been authorized for detection and/or diagnosis of SARS-CoV-2 by FDA under an Emergency Use Authorization (EUA). This EUA will remain in effect (meaning this test can be used) for the duration of the COVID-19 declaration under Section 564(b)(1) of the Act, 21 U.S.C. section 360bbb-3(b)(1), unless the authorization is terminated or revoked.  Performed at [facility], [address] [place], [place] [address]   Surgical pcr screen     Status: None   Collection Time: 05/15/21  5:06 AM   Specimen: Nasal Mucosa; Nasal Swab  Result Value Ref Range Status   MRSA, PCR NEGATIVE NEGATIVE Final   Staphylococcus aureus NEGATIVE NEGATIVE Final    Comment: (NOTE) The Xpert SA Assay (FDA approved for NASAL specimens in patients 48 years of age and older), is one component of a comprehensive surveillance program. It is not intended to diagnose infection nor to guide or monitor treatment. Performed at [facility], [place] [address], [place], [place] [address]      Time coordinating discharge:  I have spent 35 minutes face to face with the patient and on the ward discussing the patients care, assessment, plan and disposition with other care givers. >50% of the time was devoted counseling the patient about the risks and benefits of treatment/Discharge disposition and coordinating care.   SIGNED:   Damita Lack, MD  [facility] 05/18/2021, 12:38 PM   If 7PM-7AM, please contact night-coverage

## 2021-05-18 NOTE — Progress Notes (Signed)
[facility] here to transport pt to [facility].

## 2021-05-19 LAB — HEMOGLOBIN A1C
Hgb A1c MFr Bld: 5.7 % — ABNORMAL HIGH (ref 4.8–5.6)
Mean Plasma Glucose: 117 mg/dL

## 2021-05-29 ENCOUNTER — Encounter (HOSPITAL_COMMUNITY): Payer: Self-pay | Admitting: *Deleted

## 2021-05-29 ENCOUNTER — Other Ambulatory Visit: Payer: Self-pay

## 2021-05-29 ENCOUNTER — Emergency Department (HOSPITAL_COMMUNITY)
Admission: EM | Admit: 2021-05-29 | Discharge: 2021-05-30 | Disposition: A | Discharge status: Skilled Nursing Facility | Payer: Medicare Other | Attending: Emergency Medicine | Admitting: Emergency Medicine

## 2021-05-29 ENCOUNTER — Emergency Department (HOSPITAL_COMMUNITY): Payer: Medicare Other

## 2021-05-29 DIAGNOSIS — M542 Cervicalgia: Secondary | ICD-10-CM | POA: Insufficient documentation

## 2021-05-29 DIAGNOSIS — W19XXXA Unspecified fall, initial encounter: Secondary | ICD-10-CM | POA: Diagnosis not present

## 2021-05-29 DIAGNOSIS — Y92129 Unspecified place in nursing home as the place of occurrence of the external cause: Secondary | ICD-10-CM | POA: Insufficient documentation

## 2021-05-29 DIAGNOSIS — M25552 Pain in left hip: Secondary | ICD-10-CM | POA: Diagnosis present

## 2021-05-29 DIAGNOSIS — F0390 Unspecified dementia without behavioral disturbance: Secondary | ICD-10-CM | POA: Insufficient documentation

## 2021-05-29 HISTORY — DX: Major depressive disorder, single episode, unspecified: F32.9

## 2021-05-29 HISTORY — DX: Cognitive communication deficit: R41.841

## 2021-05-29 LAB — BASIC METABOLIC PANEL
Anion gap: 3 — ABNORMAL LOW (ref 5–15)
BUN: 12 mg/dL (ref 8–23)
CO2: 27 mmol/L (ref 22–32)
Calcium: 8.3 mg/dL — ABNORMAL LOW (ref 8.9–10.3)
Chloride: 106 mmol/L (ref 98–111)
Creatinine, Ser: 0.64 mg/dL (ref 0.44–1.00)
GFR, Estimated: >60 mL/min (ref >60)
Glucose, Bld: 100 mg/dL — ABNORMAL HIGH (ref 70–99)
Potassium: 5 mmol/L (ref 3.5–5.1)
Sodium: 136 mmol/L (ref 135–145)

## 2021-05-29 LAB — CBC WITH DIFFERENTIAL/PLATELET
Abs Immature Granulocytes: 0.03 10*3/uL (ref 0.00–0.07)
Basophils Absolute: 0 10*3/uL (ref 0.0–0.1)
Basophils Relative: 1 %
Eosinophils Absolute: 0 10*3/uL (ref 0.0–0.5)
Eosinophils Relative: 1 %
HCT: 35.5 % — ABNORMAL LOW (ref 36.0–46.0)
Hemoglobin: 11.1 g/dL — ABNORMAL LOW (ref 12.0–15.0)
Immature Granulocytes: 1 %
Lymphocytes Relative: 14 %
Lymphs Abs: 0.7 10*3/uL (ref 0.7–4.0)
MCH: 30.1 pg (ref 26.0–34.0)
MCHC: 31.3 g/dL (ref 30.0–36.0)
MCV: 96.2 fL (ref 80.0–100.0)
Monocytes Absolute: 0.3 10*3/uL (ref 0.1–1.0)
Monocytes Relative: 7 %
Neutro Abs: 3.6 10*3/uL (ref 1.7–7.7)
Neutrophils Relative %: 76 %
Platelets: 461 10*3/uL — ABNORMAL HIGH (ref 150–400)
RBC: 3.69 MIL/uL — ABNORMAL LOW (ref 3.87–5.11)
RDW: 13.2 % (ref 11.5–15.5)
WBC: 4.7 10*3/uL (ref 4.0–10.5)
nRBC: 0 % (ref 0.0–0.2)

## 2021-05-29 NOTE — ED Notes (Signed)
Pt resting comfortably in hallway bed

## 2021-05-29 NOTE — ED Triage Notes (Signed)
Pt brought in by [facility] from [facility] with c/o fall this morning. Pt was found this morning by nursing home staff face down on her stomach between her bed and the wall. Unwitnessed fall. Unknown down time, but staff last reports last saw her 1.5 hours ago. C-spine in place by EMS.

## 2021-05-29 NOTE — ED Notes (Signed)
Called for EMS transport back to [place]

## 2021-05-29 NOTE — Discharge Instructions (Addendum)
Call your primary care doctor or specialist as discussed in the next 2-3 days.   Return immediately back to the ER if:  Your symptoms worsen within the next 12-24 hours. You develop new symptoms such as new fevers, persistent vomiting, new pain, shortness of breath, or new weakness or numbness, or if you have any other concerns.  

## 2021-05-29 NOTE — ED Provider Notes (Signed)
[facility] EMERGENCY DEPARTMENT Provider Note   CSN: [id number] Arrival date & time: 05/29/21  [id number]     History Chief Complaint  Patient presents with   Leslie Mcknight is a 86 y.o. female.  Patient presents from a fall from her nursing home.  Complaining of persistent left hip pain since her surgery 3 weeks ago.  He is in [facility]-spine [facility] by EMS.  Otherwise patient has a history of dementia, difficult to obtain full history and review of systems from her.      Past Medical History:  Diagnosis Date   Cognitive communication deficit    Dementia [facility])    MDD (major depressive disorder), single episode    UTI (urinary tract infection)     Patient Active Problem List   Diagnosis Date Noted   Leg swelling 05/15/2021   Closed comminuted intertrochanteric fracture of proximal end of left femur ([place]) 05/14/2021   Fall at home 05/14/2021   Depressive disorder    Dementia ([place]) 08/05/2020    Past Surgical History:  Procedure Laterality Date   INTRAMEDULLARY (IM) NAIL INTERTROCHANTERIC Left 05/15/2021   Procedure: INTRAMEDULLARY (IM) NAIL INTERTROCHANTRIC;  Surgeon: Rod Can, MD;  Location: [place];  Service: Orthopedics;  Laterality: Left;     OB History   No obstetric history on file.     No family history on file.  Social History   Tobacco Use   Smoking status: Never   Smokeless tobacco: Never  Vaping Use   Vaping Use: Never used  Substance Use Topics   Alcohol use: Never   Drug use: Never    Home Medications Prior to Admission medications   Medication Sig Start Date End Date Taking? Authorizing Provider  docusate sodium (COLACE) 100 MG capsule Take 1 capsule (100 mg total) by mouth 2 (two) times daily. 05/18/21  Yes Amin, Ankit Chirag, MD  enoxaparin (LOVENOX) 40 MG/0.4ML injection Inject 0.4 mLs (40 mg total) into the skin daily for 21 days. 05/19/21 06/09/21 Yes Amin, Jeanella Flattery, MD  HYDROcodone-acetaminophen (NORCO/VICODIN) 5-325 MG tablet  Take 1-2 tablets by mouth every 4 (four) hours as needed for moderate pain (pain score 4-6). 05/18/21  Yes Damita Lack, MD    Allergies    Patient has no known allergies.  Review of Systems   Review of Systems  Unable to perform ROS: Dementia   Physical Exam Updated Vital Signs BP 134/74   Pulse 83   Temp 97.8 F (36.6 C) (Oral)   Resp 18   Ht '5\' 1"'$  (1.549 m)   Wt 52.2 kg   LMP  (LMP Unknown)   SpO2 100%   BMI 21.73 kg/m   Physical Exam Constitutional:      General: She is not in acute distress.    Appearance: Normal appearance.  HENT:     Head: Normocephalic.     Nose: Nose normal.  Eyes:     Extraocular Movements: Extraocular movements intact.  Cardiovascular:     Rate and Rhythm: Normal rate.  Pulmonary:     Effort: Pulmonary effort is normal.  Musculoskeletal:     Cervical back: Normal range of motion.     Comments: Patient has some persistent pain with attempted range of motion of the left hip.  Otherwise no gross deformity noted on visual exam.  Neurovascularly intact bilateral upper and lower extremities.  No pain with range of motion of right lower extremity or bilateral upper extremities.  Neurological:  General: No focal deficit present.     Mental Status: She is alert. Mental status is at baseline.    ED Results / Procedures / Treatments   Labs (all labs ordered are listed, but only abnormal results are displayed) Labs Reviewed  CBC WITH DIFFERENTIAL/PLATELET - Abnormal; Notable for the following components:      Result Value   RBC 3.69 (*)    Hemoglobin 11.1 (*)    HCT 35.5 (*)    Platelets 461 (*)    All other components within normal limits  BASIC METABOLIC PANEL - Abnormal; Notable for the following components:   Glucose, Bld 100 (*)    Calcium 8.3 (*)    Anion gap 3 (*)    All other components within normal limits    EKG EKG Interpretation  Date/Time:  Monday May 29 2021 08:34:52 EDT Ventricular Rate:  73 PR  Interval:  143 QRS Duration: 90 QT Interval:  376 QTC Calculation: 415 R Axis:   40 Text Interpretation: Sinus rhythm Low voltage, extremity leads Confirmed by Thamas Jaegers ([id number]) on 05/29/2021 9:06:38 AM  Radiology DG Pelvis 1-2 Views  Result Date: 05/29/2021 CLINICAL DATA:  Left hip and leg pain. EXAM: PELVIS - 1-2 VIEW COMPARISON:  05/15/2021 and left femur 05/29/2017 FINDINGS: Multiple surgical clips in the lower abdomen and pelvis. Right hip appears to be intact with sclerosis and joint space narrowing in superior right hip joint. Pelvic bony ring is intact. Again noted is a dynamic hip screw in the left femur and evidence of previous left femoral intertrochanteric fracture. Left hip appears located based on this single view. IMPRESSION: 1. No acute abnormality in the pelvis. Electronically Signed   By: Markus Daft M.D.   On: 05/29/2021 12:03   CT Head Wo Contrast  Result Date: 05/29/2021 CLINICAL DATA:  Neck pain after unwitnessed fall. EXAM: CT HEAD WITHOUT CONTRAST CT CERVICAL SPINE WITHOUT CONTRAST TECHNIQUE: Multidetector CT imaging of the head and cervical spine was performed following the standard protocol without intravenous contrast. Multiplanar CT image reconstructions of the cervical spine were also generated. COMPARISON:  None. FINDINGS: CT HEAD FINDINGS Brain: Mild diffuse cortical atrophy is noted. Mild chronic ischemic white matter disease is noted. No mass effect or midline shift is noted. Ventricular size is within normal limits. There is no evidence of mass lesion, hemorrhage or acute infarction. Vascular: No hyperdense vessel or unexpected calcification. Skull: Normal. Negative for fracture or focal lesion. Sinuses/Orbits: No acute finding. Other: None. CT CERVICAL SPINE FINDINGS Alignment: Normal. Skull base and vertebrae: No acute fracture. No primary bone lesion or focal pathologic process. Soft tissues and spinal canal: No prevertebral fluid or swelling. No visible canal  hematoma. Disc levels:  Normal. Upper chest: Negative. Other: None. IMPRESSION: No acute intracranial abnormality seen. No significant abnormality seen in the cervical spine. Electronically Signed   By: Marijo Conception M.D.   On: 05/29/2021 11:42   CT Cervical Spine Wo Contrast  Result Date: 05/29/2021 CLINICAL DATA:  Neck pain after unwitnessed fall. EXAM: CT HEAD WITHOUT CONTRAST CT CERVICAL SPINE WITHOUT CONTRAST TECHNIQUE: Multidetector CT imaging of the head and cervical spine was performed following the standard protocol without intravenous contrast. Multiplanar CT image reconstructions of the cervical spine were also generated. COMPARISON:  None. FINDINGS: CT HEAD FINDINGS Brain: Mild diffuse cortical atrophy is noted. Mild chronic ischemic white matter disease is noted. No mass effect or midline shift is noted. Ventricular size is within normal limits. There is no evidence  of mass lesion, hemorrhage or acute infarction. Vascular: No hyperdense vessel or unexpected calcification. Skull: Normal. Negative for fracture or focal lesion. Sinuses/Orbits: No acute finding. Other: None. CT CERVICAL SPINE FINDINGS Alignment: Normal. Skull base and vertebrae: No acute fracture. No primary bone lesion or focal pathologic process. Soft tissues and spinal canal: No prevertebral fluid or swelling. No visible canal hematoma. Disc levels:  Normal. Upper chest: Negative. Other: None. IMPRESSION: No acute intracranial abnormality seen. No significant abnormality seen in the cervical spine. Electronically Signed   By: Marijo Conception M.D.   On: 05/29/2021 11:42   DG Femur Min 2 Views Left  Result Date: 05/29/2021 CLINICAL DATA:  Left leg pain after fall today. EXAM: LEFT FEMUR 2 VIEWS COMPARISON:  None. FINDINGS: Status post surgical internal fixation of proximal left femoral fracture. No acute fracture or dislocation is noted. IMPRESSION: Postsurgical changes as described above. Electronically Signed   By: Marijo Conception M.D.   On: 05/29/2021 12:04    Procedures Procedures   Medications Ordered in ED Medications - No data to display  ED Course  I have reviewed the triage vital signs and the nursing notes.  Pertinent labs & imaging results that were available during my care of the patient were reviewed by me and considered in my medical decision making (see chart for details).    MDM Rules/Calculators/A&P                           Labs sent and imaging pursued.  Labs are unremarkable and imaging shows no acute findings.  Patient has no complaints of pain at this time.  Given negative work-up, will be discharged home.  Will advise outpatient follow-up with her doctors within the week.  Advising return for fevers new pains or any additional concerns.  Final Clinical Impression(s) / ED Diagnoses Final diagnoses:  Fall, initial encounter    Rx / DC Orders ED Discharge Orders     None        Luna Fuse, MD 05/29/21 1227

## 2021-12-08 ENCOUNTER — Emergency Department (HOSPITAL_COMMUNITY)
Admission: EM | Admit: 2021-12-08 | Discharge: 2021-12-08 | Disposition: A | Discharge status: Home / Self Care | Payer: Medicare Other | Attending: Emergency Medicine | Admitting: Emergency Medicine

## 2021-12-08 ENCOUNTER — Emergency Department (HOSPITAL_COMMUNITY): Payer: Medicare Other

## 2021-12-08 ENCOUNTER — Other Ambulatory Visit: Payer: Self-pay

## 2021-12-08 DIAGNOSIS — F0390 Unspecified dementia without behavioral disturbance: Secondary | ICD-10-CM | POA: Diagnosis not present

## 2021-12-08 DIAGNOSIS — W01198A Fall on same level from slipping, tripping and stumbling with subsequent striking against other object, initial encounter: Secondary | ICD-10-CM | POA: Insufficient documentation

## 2021-12-08 DIAGNOSIS — W19XXXA Unspecified fall, initial encounter: Secondary | ICD-10-CM

## 2021-12-08 DIAGNOSIS — Z7982 Long term (current) use of aspirin: Secondary | ICD-10-CM | POA: Insufficient documentation

## 2021-12-08 DIAGNOSIS — S0101XA Laceration without foreign body of scalp, initial encounter: Secondary | ICD-10-CM | POA: Diagnosis not present

## 2021-12-08 DIAGNOSIS — S0990XA Unspecified injury of head, initial encounter: Secondary | ICD-10-CM

## 2021-12-08 DIAGNOSIS — Z23 Encounter for immunization: Secondary | ICD-10-CM | POA: Insufficient documentation

## 2021-12-08 MED ORDER — TETANUS-DIPHTH-ACELL PERTUSSIS 5-2.5-18.5 LF-MCG/0.5 IM SUSY
0.5000 mL | PREFILLED_SYRINGE | Freq: Once | INTRAMUSCULAR | Status: AC
  Administered 2021-12-08: 0.5 mL via INTRAMUSCULAR
  Filled 2021-12-08: qty 0.5

## 2021-12-08 MED ORDER — HYDROGEN PEROXIDE 3 % EX SOLN
CUTANEOUS | Status: AC
  Filled 2021-12-08: qty 473

## 2021-12-08 NOTE — ED Notes (Signed)
Patient transported to CT 

## 2021-12-08 NOTE — ED Provider Notes (Signed)
[facility] Provider Note   CSN: [id number] Arrival date & time: 12/08/21  1641     History  Chief Complaint  Patient presents with   Leslie Mcknight is a 87 y.o. female.  Patient fell and hit her head.  Patient has a history of dementia  The history is provided by the nursing home and medical records. The history is limited by the condition of the patient.  Fall This is a new problem. The current episode started 3 to 5 hours ago. The problem occurs rarely. The problem has been resolved. Nothing aggravates the symptoms. Nothing relieves the symptoms. She has tried nothing for the symptoms.      Home Medications Prior to Admission medications   Medication Sig Start Date End Date Taking? Authorizing Provider  aspirin EC 81 MG tablet Take 81 mg by mouth daily. Swallow whole.   Yes [provider]  atorvastatin (LIPITOR) 20 MG tablet Take 20 mg by mouth at bedtime.   Yes [provider]  ferrous sulfate 325 (65 FE) MG tablet Take 325 mg by mouth daily with breakfast.   Yes [provider]  HYDROcodone-acetaminophen (NORCO/VICODIN) 5-325 MG tablet Take 1-2 tablets by mouth every 4 (four) hours as needed for moderate pain (pain score 4-6). 05/18/21  Yes Amin, Jeanella Flattery, MD  Multiple Vitamin (MULTIVITAMIN) tablet Take 1 tablet by mouth daily.   Yes [provider]  docusate sodium (COLACE) 100 MG capsule Take 1 capsule (100 mg total) by mouth 2 (two) times daily. Patient not taking: Reported on 12/08/2021 05/18/21   Damita Lack, MD  enoxaparin (LOVENOX) 40 MG/0.4ML injection Inject 0.4 mLs (40 mg total) into the skin daily for 21 days. Patient not taking: Reported on 12/08/2021 05/19/21 06/09/21  Damita Lack, MD      Allergies    Patient has no known allergies.    Review of Systems   Review of Systems  Unable to perform ROS: Dementia   Physical Exam Updated Vital Signs BP (!) 165/76    Pulse (!) 57     Temp 98 F (36.7 C) (Oral)    Resp 15    Ht 5\' 1"  (1.549 m)    LMP  (LMP Unknown)    SpO2 98%    BMI 21.73 kg/m  Physical Exam Constitutional:      Appearance: Normal appearance. She is well-developed.  HENT:     Head: Normocephalic.     Comments: 1 cm laceration superficial to the occipital area    Nose: Nose normal.  Eyes:     General: No scleral icterus.    Conjunctiva/sclera: Conjunctivae normal.  Neck:     Thyroid: No thyromegaly.  Cardiovascular:     Rate and Rhythm: Normal rate and regular rhythm.     Heart sounds: No murmur heard.   No friction rub. No gallop.  Pulmonary:     Breath sounds: No stridor. No wheezing or rales.  Chest:     Chest wall: No tenderness.  Abdominal:     General: There is no distension.     Tenderness: There is no abdominal tenderness. There is no rebound.  Musculoskeletal:        General: Normal range of motion.     Cervical back: Neck supple.  Lymphadenopathy:     Cervical: No cervical adenopathy.  Skin:    Findings: No erythema or rash.  Neurological:     Mental Status: She is alert.  Motor: No abnormal muscle tone.     Coordination: Coordination normal.     Comments: Oriented to person only    ED Results / Procedures / Treatments   Labs (all labs ordered are listed, but only abnormal results are displayed) Labs Reviewed - No data to display  EKG None  Radiology CT Head Wo Contrast  Result Date: 12/08/2021 CLINICAL DATA:  Status post trauma. EXAM: CT HEAD WITHOUT CONTRAST TECHNIQUE: Contiguous axial images were obtained from the base of the skull through the vertex without intravenous contrast. RADIATION DOSE REDUCTION: This exam was performed according to the departmental dose-optimization program which includes automated exposure control, adjustment of the mA and/or kV according to patient size and/or use of iterative reconstruction technique. COMPARISON:  May 29, 2021 FINDINGS: Brain: There is mild cerebral atrophy with  widening of the extra-axial spaces and ventricular dilatation. There are areas of decreased attenuation within the white matter tracts of the supratentorial brain, consistent with microvascular disease changes. Vascular: No hyperdense vessel or unexpected calcification. Skull: Normal. Negative for fracture or focal lesion. Sinuses/Orbits: No acute finding. Other: A 2.0 cm x 0.9 cm well-defined area of focal soft tissue swelling (approximately 35.35 Hounsfield units) is seen along the lateral periorbital region on the left. This represents a new finding when compared to the prior study. Mild scalp soft tissue swelling is seen along the posterior aspect of the vertex on the right (axial CT image 69, CT series 3). IMPRESSION: 1. Focal soft tissue swelling along the lateral periorbital region on the left, as described above, which may represent a small hematoma. Interval development of a soft tissue mass cannot be excluded. Correlation with physical examination is recommended. 2. No acute intracranial abnormality. Electronically Signed   By: Virgina Norfolk M.D.   On: 12/08/2021 22:08   CT Cervical Spine Wo Contrast  Result Date: 12/08/2021 CLINICAL DATA:  Neck trauma.  Unwitnessed fall. EXAM: CT CERVICAL SPINE WITHOUT CONTRAST TECHNIQUE: Multidetector CT imaging of the cervical spine was performed without intravenous contrast. Multiplanar CT image reconstructions were also generated. RADIATION DOSE REDUCTION: This exam was performed according to the departmental dose-optimization program which includes automated exposure control, adjustment of the mA and/or kV according to patient size and/or use of iterative reconstruction technique. COMPARISON:  CT cervical spine 05/29/2021 FINDINGS: Alignment: No traumatic subluxation. Mildly exaggerated cervical lordosis. No listhesis. Skull base and vertebrae: No acute fracture. Vertebral body heights are maintained. The dens and skull base are intact. Soft tissues and  spinal canal: No prevertebral fluid or swelling. No visible canal hematoma. Disc levels:  Minor degenerative disc disease for age. Upper chest: Biapical pleuroparenchymal scarring. No acute findings. Other: None. IMPRESSION: No acute fracture or traumatic subluxation of the cervical spine. Electronically Signed   By: Keith Rake M.D.   On: 12/08/2021 22:13    Procedures .Marland KitchenLaceration Repair  Date/Time: 12/10/2021 10:26 AM Performed by: Milton Ferguson, MD Authorized by: Milton Ferguson, MD   Comments:     Patient has a laceration to the occipital area that is 1 cm superficial area.  Patient cleaned thoroughly with Betadine.  2 staples were used to close the laceration.  Patient tolerated the procedure well    Medications Ordered in ED Medications  hydrogen peroxide 3 % external solution (has no administration in time range)    ED Course/ Medical Decision Making/ A&P  Medical Decision Making Amount and/or Complexity of Data Reviewed Radiology: ordered.  Risk Prescription drug management.  Patient with a fall and minor head injury causing laceration Patient had a 1 cm laceration to occipital head.  It was closed with 2 staples.    This patient presents to the ED for concern of fall, this involves an extensive number of treatment options, and is a complaint that carries with it a high risk of complications and morbidity.  The differential diagnosis includes severe head injury   Co morbidities that complicate the patient evaluation  Dementia   Additional history obtained:  Additional history obtained from nursing home and EMS External records from outside source obtained and reviewed including hospital record   Lab Tests:  No labs ordered  Imaging Studies ordered:  I ordered imaging studies including CT head and cervical spine I independently visualized and interpreted imaging which showed no acute injury I agree with the radiologist  interpretation   Cardiac Monitoring:  The patient was maintained on a cardiac monitor.  I personally viewed and interpreted the cardiac monitored which showed an underlying rhythm of: Normal sinus rhythm   Medicines ordered and prescription drug management:  No medicines given  Test Considered:  MRI brain   Critical Interventions:  None   Consultations Obtained:  No consultant  Problem List / ED Course:  Dementia and mild head injury   Reevaluation:  After the interventions noted above, I reevaluated the patient and found that they have :improved   Social Determinants of Health:  Lives in a nursing   Dispostion:  After consideration of the diagnostic results and the patients response to treatment, I feel that the patent would benefit from discharge home with wound care and staples out in a week.         Final Clinical Impression(s) / ED Diagnoses Final diagnoses:  Fall, initial encounter  Injury of head, initial encounter    Rx / DC Orders ED Discharge Orders     None         Milton Ferguson, MD 12/10/21 1029

## 2021-12-08 NOTE — ED Triage Notes (Signed)
Pt arrived via [facility] c/o an unwitnessed fall in which she hit her head. Pt is not on any blood thinners; However, she does take aspirin. Hx of dementia. Baseline for cognition

## 2021-12-08 NOTE — Discharge Instructions (Signed)
Clean laceration twice a day with soap and water.  Staples should be removed in a week

## 2021-12-08 NOTE — ED Notes (Signed)
Attempted to give report to SNF, unable to make contact.

## 2022-01-29 ENCOUNTER — Ambulatory Visit (INDEPENDENT_AMBULATORY_CARE_PROVIDER_SITE_OTHER): Payer: Medicare Other | Admitting: Plastic Surgery

## 2022-01-29 VITALS — BP 145/67 | HR 70 | Ht 59.0 in | Wt 102.8 lb

## 2022-01-29 DIAGNOSIS — C44320 Squamous cell carcinoma of skin of unspecified parts of face: Secondary | ICD-10-CM

## 2022-01-29 NOTE — Progress Notes (Signed)
? ?  Referring Provider ?Karin Golden, MD ?[address] [place] [address]  ? ?CC:  ?Left temple squamous cell carcinoma ? ?Leslie Mcknight is an 87 y.o. female.  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  She is planning on having Mohs excision of this.  Patient's past medical history is notable for dementia.  Family is not present today. ? ?No Known Allergies ? ?Outpatient Encounter Medications as of 01/29/2022  ?Medication Sig  ? aspirin EC 81 MG tablet Take 81 mg by mouth daily. Swallow whole.  ? atorvastatin (LIPITOR) 20 MG tablet Take 20 mg by mouth at bedtime.  ? docusate sodium (COLACE) 100 MG capsule Take 1 capsule (100 mg total) by mouth 2 (two) times daily.  ? ferrous sulfate 325 (65 FE) MG tablet Take 325 mg by mouth daily with breakfast.  ? HYDROcodone-acetaminophen (NORCO/VICODIN) 5-325 MG tablet Take 1-2 tablets by mouth every 4 (four) hours as needed for moderate pain (pain score 4-6).  ? Multiple Vitamin (MULTIVITAMIN) tablet Take 1 tablet by mouth daily.  ? [DISCONTINUED] enoxaparin (LOVENOX) 40 MG/0.4ML injection Inject 0.4 mLs (40 mg total) into the skin daily for 21 days. (Patient not taking: Reported on 12/08/2021)  ? ?No facility-administered encounter medications on file as of 01/29/2022.  ?  ? ?Past Medical History:  ?Diagnosis Date  ? Cognitive communication deficit   ? Dementia ([place])   ? MDD (major depressive disorder), single episode   ? UTI (urinary tract infection)   ? ? ?Past Surgical History:  ?Procedure Laterality Date  ? INTRAMEDULLARY (IM) NAIL INTERTROCHANTERIC Left 05/15/2021  ? Procedure: INTRAMEDULLARY (IM) NAIL INTERTROCHANTRIC;  Surgeon: Rod Can, MD;  Location: [facility];  Service: Orthopedics;  Laterality: Left;  ? ? ?No family history on file. ? ?Social History  ? ?Social History Narrative  ? Not on file  ?  ? ?Review of Systems ?General: Denies fevers, chills, weight loss ?CV: Denies chest pain, shortness of  breath, palpitations ? ? ?Physical Exam ? ?  01/29/2022  ? 12:00 PM 12/08/2021  ? 11:30 PM 12/08/2021  ? 11:00 PM  ?Vitals with BMI  ?Height '4\' 11"'$     ?Weight 102 lbs 13 oz    ?BMI 20.75    ?Systolic [phone]  ?Diastolic 67 66 37  ?Pulse 70 57 86  ?  ?General:  No acute distress,  Alert and oriented, Non-Toxic, Normal speech and affect ?HEENT: Large left temple fungating lesion.  Lesion is greater than 5 cm and with. ? ?Assessment/Plan ?Reconstruction with likely graft or skin graft substitute indicated given the size of the lesion.  If there are any exposed structures that need to be covered prior to skin grafting then I would consider a staged reconstruction utilizing skin graft substitute such as Integra prior.  There is a chance we would not utilize local tissue rearrangement. ? ?Lennice Sites ?01/29/2022, 10:33 PM  ? ? ?  ?

## 2022-02-07 ENCOUNTER — Ambulatory Visit (INDEPENDENT_AMBULATORY_CARE_PROVIDER_SITE_OTHER): Payer: Medicare Other | Admitting: Physician Assistant

## 2022-02-07 ENCOUNTER — Encounter: Payer: Self-pay | Admitting: Physician Assistant

## 2022-02-07 VITALS — BP 157/75 | HR 75 | Ht 59.0 in | Wt 101.2 lb

## 2022-02-07 DIAGNOSIS — C44320 Squamous cell carcinoma of skin of unspecified parts of face: Secondary | ICD-10-CM

## 2022-02-07 NOTE — Progress Notes (Signed)
? ?  Patient ID: Leslie Mcknight, female    DOB: [birth year], 87 y.o.   MRN: [id number] ? ?Chief Complaint  ?Patient presents with  ? Pre-op Exam  ? ? ?  ICD-10-CM   ?1. Squamous cell carcinoma, face  C44.320   ?  ? ? ? ?History of Present Illness: ?Leslie Mcknight is a 87 y.o.  female  with a history of dementia and left temple squamous cell carcinoma.  She presents for preoperative evaluation for upcoming procedure, debridement and closure of scalp Mohs defect, scheduled for 02/20/2022 with Dr.  Erin Hearing . ? ?The patient has not had problems with anesthesia.  Patient is accompanied by her niece, Benita Stabile, as well as a representative from her assisted living facility at bedside.  Patient is pleasant.  Her niece is able to answer much of her medical history questions.  She evidently had ovarian cancer years ago, s/p removal and subsequent chemotherapy.  They deny any cardiac history.  Tyler Aas NP is her primary care provider at the assisted living facility.  Discussed that we would prefer for her to hold her aspirin 81 mg perioperatively.  She has her Mohs excision scheduled for 02/19/2022, will ask for last day of aspirin to be 02/13/2022.  She may resume day after her reconstruction with Dr. Erin Hearing.  They deny any known personal or family history of blood clots or clotting disorder.  She is chronically on Colace for constipation as well as as needed narcotic pain medication. ? ?Summary of Previous Visit: Patient was seen for consult on 01/29/2022.  At that time, discussed upcoming Mohs excision and plan for likely skin graft or skin graft substitute to address her anticipated defect.  Discussed possible staged reconstruction using Integra.  They also discussed possible local tissue rearrangement. ? ?PMH Significant for: Squamous cell carcinoma on face, dementia, mechanical falls at home, HLD on aspirin, remote history of cancer (?). ? ? ?Past Medical History: ?Allergies: ?No Known Allergies ? ?Current  Medications: ? ?Current Outpatient Medications:  ?  aspirin EC 81 MG tablet, Take 81 mg by mouth daily. Swallow whole., Disp: , Rfl:  ?  atorvastatin (LIPITOR) 20 MG tablet, Take 20 mg by mouth at bedtime., Disp: , Rfl:  ?  docusate sodium (COLACE) 100 MG capsule, Take 1 capsule (100 mg total) by mouth 2 (two) times daily., Disp: 10 capsule, Rfl: 0 ?  ferrous sulfate 325 (65 FE) MG tablet, Take 325 mg by mouth daily with breakfast., Disp: , Rfl:  ?  HYDROcodone-acetaminophen (NORCO/VICODIN) 5-325 MG tablet, Take 1-2 tablets by mouth every 4 (four) hours as needed for moderate pain (pain score 4-6)., Disp: 15 tablet, Rfl: 0 ?  Multiple Vitamin (MULTIVITAMIN) tablet, Take 1 tablet by mouth daily., Disp: , Rfl:  ? ?Past Medical Problems: ?Past Medical History:  ?Diagnosis Date  ? Cognitive communication deficit   ? Dementia ([place])   ? MDD (major depressive disorder), single episode   ? UTI (urinary tract infection)   ? ? ?Past Surgical History: ?Past Surgical History:  ?Procedure Laterality Date  ? INTRAMEDULLARY (IM) NAIL INTERTROCHANTERIC Left 05/15/2021  ? Procedure: INTRAMEDULLARY (IM) NAIL INTERTROCHANTRIC;  Surgeon: Rod Can, MD;  Location: [facility];  Service: Orthopedics;  Laterality: Left;  ? ? ?Social History: ?Social History  ? ?Socioeconomic History  ? Marital status: Widowed  ?  Spouse name: Not on file  ? Number of children: Not on file  ? Years of education: Not on file  ? Highest education  level: Not on file  ?Occupational History  ? Not on file  ?Tobacco Use  ? Smoking status: Never  ? Smokeless tobacco: Never  ?Vaping Use  ? Vaping Use: Never used  ?Substance and Sexual Activity  ? Alcohol use: Never  ? Drug use: Never  ? Sexual activity: Not on file  ?Other Topics Concern  ? Not on file  ?Social History Narrative  ? Not on file  ? ?Social Determinants of Health  ? ?Financial Resource Strain: Not on file  ?Food Insecurity: Not on file  ?Transportation Needs: Not on file  ?Physical Activity: Not on  file  ?Stress: Not on file  ?Social Connections: Not on file  ?Intimate Partner Violence: Not on file  ? ? ?Family History: ?No family history on file. ? ?Review of Systems: ?ROS ?Level 5 caveat due to dementia. ? ?Physical Exam: ?Vital Signs ?BP (!) 157/75 (BP Location: Left Arm, Patient Position: Sitting, Cuff Size: Normal)   Pulse 75   Ht '4\' 11"'$  (1.499 m)   Wt 101 lb 3.2 oz (45.9 kg)   LMP  (LMP Unknown)   SpO2 99%   BMI 20.44 kg/m?  ? ?Physical Exam ?Constitutional:   ?   General: Not in acute distress. ?   Appearance: Normal appearance. Not ill-appearing.  ?HENT:  ?   Head: Normocephalic and atraumatic.  ?Eyes:  ?   Pupils: Pupils are equal, round. ?Cardiovascular:  ?   Rate and Rhythm: Normal rate. ?   Pulses: Normal pulses.  ?Pulmonary:  ?   Effort: No respiratory distress or increased work of breathing.  Speaks in full sentences. ?Abdominal:  ?   General: Abdomen is flat. No distension.   ?Musculoskeletal: Normal range of motion. ?Skin: ?   General: Skin is warm and dry.  ?   Findings: No erythema or rash.  ?Neurological:  ?   Mental Status: Alert and oriented to person, place, and time.  ?Psychiatric:     ?   Mood and Affect: Mood normal.     ?   Behavior: Behavior normal.  ? ? ?Assessment/Plan: ?The patient is scheduled for debridement and closure of Mohs defect with Dr. Erin Hearing.  Risks, benefits, and alternatives of procedure discussed, questions answered and consent obtained.   ? ?Smoking Status: Non-smoker. ? ?Caprini Score: 7; Risk Factors include: Age, personal history of cancer (remote), and length of planned surgery. Recommendation for mechanical and possibly pharmacological prophylaxis. Encourage early ambulation.  Suspect that she will simply need to resume her 81 mg aspirin after surgery, but will discuss with Dr. Erin Hearing.  Given that she is a fall risk, may also want to consider holding any additional blood thinning agents perioperatively. ? ?Pictures obtained: 01/29/2022 ? ?Post-op Rx sent  to pharmacy: None.  Managed within the assisted living facility.  She already has existing orders for as needed narcotics. ? ?Patient was provided with the General Surgical Risk consent document and Pain Medication Agreement prior to their appointment.  They had adequate time to read through the risk consent documents and Pain Medication Agreement. We also discussed them in person together during this preop appointment. All of their questions were answered to their satisfaction.  Recommended calling if they have any further questions.  Risk consent form and Pain Medication Agreement to be scanned into patient's chart. ? ? ? ?Electronically signed by: Krista Blue, PA-C 02/07/2022 2:43 PM ?

## 2022-02-08 ENCOUNTER — Encounter: Payer: Self-pay | Admitting: Physician Assistant

## 2022-02-08 ENCOUNTER — Telehealth: Payer: Self-pay

## 2022-02-08 NOTE — H&P (View-Only) (Signed)
Surgical clearance received from patient's primary care provider, Crist Infante NP, to hold aspirin perioperatively.  Hold 02/13/2022, resume 02/21/2022. ?

## 2022-02-08 NOTE — Telephone Encounter (Signed)
Faxed surgery clearance to Tyler Aas, NP  regarding holding ASA for surgery. ?

## 2022-02-08 NOTE — Progress Notes (Signed)
Surgical clearance received from patient's primary care provider, Crist Infante NP, to hold aspirin perioperatively.  Hold 02/13/2022, resume 02/21/2022. ?

## 2022-02-16 ENCOUNTER — Encounter (HOSPITAL_COMMUNITY): Payer: Self-pay | Admitting: Plastic Surgery

## 2022-02-19 ENCOUNTER — Encounter (HOSPITAL_COMMUNITY): Payer: Self-pay | Admitting: Plastic Surgery

## 2022-02-19 NOTE — Progress Notes (Signed)
PCP - Crist Infante, NP ?Cardiologist - n/a ? ?Chest x-ray - n/a ?EKG - 05/29/21 ?Stress Test - n/a ?ECHO - n/a ?Cardiac Cath - n/a ? ?ICD Pacemaker/Loop - n/a ? ?Sleep Study -  Yes ?CPAP - does not use CPAP ? ?Aspirin Instructions:  Last dose was on 02/13/22. ? ?ERAS: Clear liquids til 9 AM DOS. ? ?Anesthesia review: Yes ? ?STOP now taking any Aspirin (unless otherwise instructed by your surgeon), Aleve, Naproxen, Ibuprofen, Motrin, Advil, Goody's, BC's, all herbal medications, fish oil, and all vitamins.  ? ?Coronavirus Screening ?Does the patient have any of the following symptoms:  ?Cough yes/no: No ?Fever (>100.42F)  yes/no: No ?Runny nose yes/no: No ?Sore throat yes/no: No ?Difficulty breathing/shortness of breath  yes/no: No ? ?Have you traveled in the last 14 days and where? yes/no: No ? ?Jonelle Sidle, Medication Aide [phone] for patient verbalized understanding of instructions that were given via phone and was also faxed to [phone] to Tiffany's attention. ?

## 2022-02-19 NOTE — Progress Notes (Signed)
Surgical Instructions for Leslie Mcknight (DOB 11/08/1934) ? ? ? Your procedure is scheduled on Tuesday, 02/20/22. ? Report to [facility] Main Entrance "A" at 9:45 A.M., then check in with the Admitting office. ? Call this number if you have problems the morning of surgery: ? [phone] ? ? If you have any questions prior to your surgery date call [phone]: Open Monday-Friday 8am-4pm ? ? ? Remember: ? Do not eat or drink after midnight tonight - Monday (02/19/22).   ?  ? Take these medicines the morning of surgery with A SIP OF WATER:  Vicodin PRN ? ? ?As of today, STOP taking any Aspirin (unless otherwise instructed by your surgeon) Aleve, Naproxen, Ibuprofen, Motrin, Advil, Goody's, BC's, all herbal medications, fish oil, and all vitamins. ? ?         ?Do not wear jewelry or makeup ?Do not wear lotions, powders, perfumes, or deodorant. ?Do not shave 48 hours prior to surgery.   ?Do not bring valuables to the hospital. ?Do not wear nail polish, gel polish, artificial nails, or any other type of covering on natural nails (fingers and toes) ?If you have artificial nails or gel coating that need to be removed by a nail salon, please have this removed prior to surgery. Artificial nails or gel coating may interfere with anesthesia's ability to adequately monitor your vital signs. ? ?[facility] is not responsible for any belongings or valuables. .  ? ?Do NOT Smoke (Tobacco/Vaping)  24 hours prior to your procedure ? ?If you use a CPAP at night, you may bring your mask for your overnight stay. ?  ?Contacts, glasses, hearing aids, dentures or partials may not be worn into surgery, please bring cases for these belongings ?   ?Patients discharged the day of surgery will not be allowed to drive home, and someone needs to stay with them for 24 hours. ? ? ?SURGICAL WAITING ROOM VISITATION ?Patients having surgery or a procedure in a hospital may have two support people. ?Children under the age of 14 must have an adult with them  who is not the patient. ?They may stay in the waiting area during the procedure and may switch out with other visitors. If the patient needs to stay at the hospital during part of their recovery, the visitor guidelines for inpatient rooms apply. ? ?Please refer to the [facility] website for the visitor guidelines for Inpatients (after your surgery is over and you are in a regular room).  ? ? ?Special instructions:   ? ?Oral Hygiene is also important to reduce your risk of infection.  Remember - BRUSH YOUR TEETH THE MORNING OF SURGERY WITH YOUR REGULAR TOOTHPASTE ? ? ?

## 2022-02-20 ENCOUNTER — Ambulatory Visit (HOSPITAL_COMMUNITY): Payer: Medicare Other | Admitting: Emergency Medicine

## 2022-02-20 ENCOUNTER — Ambulatory Visit (HOSPITAL_COMMUNITY)
Admission: RE | Admit: 2022-02-20 | Discharge: 2022-02-20 | Disposition: A | Discharge status: Home / Self Care | Payer: Medicare Other | Attending: Plastic Surgery | Admitting: Plastic Surgery

## 2022-02-20 ENCOUNTER — Ambulatory Visit (HOSPITAL_BASED_OUTPATIENT_CLINIC_OR_DEPARTMENT_OTHER): Payer: Medicare Other | Admitting: Emergency Medicine

## 2022-02-20 ENCOUNTER — Encounter (HOSPITAL_COMMUNITY): Payer: Self-pay | Admitting: Plastic Surgery

## 2022-02-20 ENCOUNTER — Other Ambulatory Visit: Payer: Self-pay

## 2022-02-20 ENCOUNTER — Encounter (HOSPITAL_COMMUNITY)
Admission: RE | Disposition: A | Discharge status: Home / Self Care | Payer: Self-pay | Source: Home / Self Care | Attending: Plastic Surgery

## 2022-02-20 DIAGNOSIS — G4730 Sleep apnea, unspecified: Secondary | ICD-10-CM | POA: Diagnosis not present

## 2022-02-20 DIAGNOSIS — C44329 Squamous cell carcinoma of skin of other parts of face: Secondary | ICD-10-CM | POA: Insufficient documentation

## 2022-02-20 HISTORY — PX: DEBRIDEMENT AND CLOSURE WOUND: SHX5614

## 2022-02-20 HISTORY — DX: Sleep apnea, unspecified: G47.30

## 2022-02-20 HISTORY — PX: SKIN SPLIT GRAFT: SHX444

## 2022-02-20 HISTORY — DX: Anemia, unspecified: D64.9

## 2022-02-20 HISTORY — DX: Hyperlipidemia, unspecified: E78.5

## 2022-02-20 LAB — CBC
HCT: 38.5 % (ref 36.0–46.0)
Hemoglobin: 12.8 g/dL (ref 12.0–15.0)
MCH: 31.8 pg (ref 26.0–34.0)
MCHC: 33.2 g/dL (ref 30.0–36.0)
MCV: 95.5 fL (ref 80.0–100.0)
Platelets: 247 10*3/uL (ref 150–400)
RBC: 4.03 MIL/uL (ref 3.87–5.11)
RDW: 13 % (ref 11.5–15.5)
WBC: 5.8 10*3/uL (ref 4.0–10.5)
nRBC: 0 % (ref 0.0–0.2)

## 2022-02-20 SURGERY — DEBRIDEMENT, WOUND, WITH CLOSURE
Anesthesia: General | Site: Face | Laterality: Left

## 2022-02-20 MED ORDER — CHLORHEXIDINE GLUCONATE 0.12 % MT SOLN: 15.0000 mL | Freq: Once | OROMUCOSAL | Status: AC

## 2022-02-20 MED ORDER — LACTATED RINGERS IV SOLN: INTRAVENOUS | Status: DC

## 2022-02-20 MED ORDER — DEXAMETHASONE SODIUM PHOSPHATE 10 MG/ML IJ SOLN
INTRAMUSCULAR | Status: DC | PRN
Start: 2022-02-20 — End: 2022-02-20
  Administered 2022-02-20: 10 mg via INTRAVENOUS

## 2022-02-20 MED ORDER — 0.9 % SODIUM CHLORIDE (POUR BTL) OPTIME
TOPICAL | Status: DC | PRN
  Administered 2022-02-20: 1000 mL

## 2022-02-20 MED ORDER — PHENYLEPHRINE 80 MCG/ML (10ML) SYRINGE FOR IV PUSH (FOR BLOOD PRESSURE SUPPORT)
PREFILLED_SYRINGE | INTRAVENOUS | Status: DC | PRN
  Administered 2022-02-20 (×3): 80 ug via INTRAVENOUS

## 2022-02-20 MED ORDER — CHLORHEXIDINE GLUCONATE 0.12 % MT SOLN
OROMUCOSAL | Status: AC
  Administered 2022-02-20: 15 mL via OROMUCOSAL
  Filled 2022-02-20: qty 15

## 2022-02-20 MED ORDER — DEXAMETHASONE SODIUM PHOSPHATE 10 MG/ML IJ SOLN
INTRAMUSCULAR | Status: AC
  Filled 2022-02-20: qty 1

## 2022-02-20 MED ORDER — CEFAZOLIN SODIUM-DEXTROSE 2-4 GM/100ML-% IV SOLN
INTRAVENOUS | Status: AC
  Filled 2022-02-20: qty 100

## 2022-02-20 MED ORDER — ROCURONIUM BROMIDE 10 MG/ML (PF) SYRINGE
PREFILLED_SYRINGE | INTRAVENOUS | Status: DC | PRN
  Administered 2022-02-20: 40 mg via INTRAVENOUS

## 2022-02-20 MED ORDER — FENTANYL CITRATE (PF) 250 MCG/5ML IJ SOLN
INTRAMUSCULAR | Status: AC
  Filled 2022-02-20: qty 5

## 2022-02-20 MED ORDER — LIDOCAINE 2% (20 MG/ML) 5 ML SYRINGE
INTRAMUSCULAR | Status: DC | PRN
  Administered 2022-02-20: 80 mg via INTRAVENOUS

## 2022-02-20 MED ORDER — ORAL CARE MOUTH RINSE: 15.0000 mL | Freq: Once | OROMUCOSAL | Status: AC

## 2022-02-20 MED ORDER — ONDANSETRON HCL 4 MG/2ML IJ SOLN
INTRAMUSCULAR | Status: AC
  Filled 2022-02-20: qty 2

## 2022-02-20 MED ORDER — EPINEPHRINE PF 1 MG/ML IJ SOLN
INTRAMUSCULAR | Status: AC
  Filled 2022-02-20: qty 2

## 2022-02-20 MED ORDER — PHENYLEPHRINE HCL-NACL 20-0.9 MG/250ML-% IV SOLN
INTRAVENOUS | Status: DC | PRN
  Administered 2022-02-20: 30 ug/min via INTRAVENOUS

## 2022-02-20 MED ORDER — SUGAMMADEX SODIUM 200 MG/2ML IV SOLN
INTRAVENOUS | Status: DC | PRN
  Administered 2022-02-20: 200 mg via INTRAVENOUS

## 2022-02-20 MED ORDER — LIDOCAINE 2% (20 MG/ML) 5 ML SYRINGE
INTRAMUSCULAR | Status: AC
  Filled 2022-02-20: qty 5

## 2022-02-20 MED ORDER — MINERAL OIL LIGHT 100 % EX OIL
TOPICAL_OIL | CUTANEOUS | Status: DC | PRN
  Administered 2022-02-20: 10 via TOPICAL

## 2022-02-20 MED ORDER — ONDANSETRON HCL 4 MG/2ML IJ SOLN
INTRAMUSCULAR | Status: DC | PRN
  Administered 2022-02-20: 4 mg via INTRAVENOUS

## 2022-02-20 MED ORDER — PROPOFOL 10 MG/ML IV BOLUS
INTRAVENOUS | Status: DC | PRN
  Administered 2022-02-20: 80 mg via INTRAVENOUS

## 2022-02-20 MED ORDER — FENTANYL CITRATE (PF) 100 MCG/2ML IJ SOLN
INTRAMUSCULAR | Status: DC | PRN
  Administered 2022-02-20 (×3): 50 ug via INTRAVENOUS

## 2022-02-20 MED ORDER — CEFAZOLIN SODIUM-DEXTROSE 2-3 GM-%(50ML) IV SOLR
INTRAVENOUS | Status: DC | PRN
  Administered 2022-02-20: 2 g via INTRAVENOUS

## 2022-02-20 SURGICAL SUPPLY — 35 items
APL SKNCLS STERI-STRIP NONHPOA (GAUZE/BANDAGES/DRESSINGS) ×1
BALL CTTN LRG ABS STRL LF (GAUZE/BANDAGES/DRESSINGS) ×4
BENZOIN TINCTURE PRP APPL 2/3 (GAUZE/BANDAGES/DRESSINGS) ×1 IMPLANT
BLADE DERMATOME SS (BLADE) ×1 IMPLANT
BNDG GAUZE ELAST 4 BULKY (GAUZE/BANDAGES/DRESSINGS) ×1 IMPLANT
CANISTER SUCT 3000ML PPV (MISCELLANEOUS) ×1 IMPLANT
COTTONBALL LRG STERILE PKG (GAUZE/BANDAGES/DRESSINGS) ×6 IMPLANT
COVER SURGICAL LIGHT HANDLE (MISCELLANEOUS) ×3 IMPLANT
DERMACARRIERS GRAFT 1 TO 1.5 (DISPOSABLE) ×2
DRAPE HALF SHEET 40X57 (DRAPES) ×1 IMPLANT
DRAPE ORTHO SPLIT 77X108 STRL (DRAPES) ×4
DRAPE SURG ORHT 6 SPLT 77X108 (DRAPES) IMPLANT
DRSG PAD ABDOMINAL 8X10 ST (GAUZE/BANDAGES/DRESSINGS) ×1 IMPLANT
DRSG TEGADERM 4X4.75 (GAUZE/BANDAGES/DRESSINGS) ×3 IMPLANT
ELECT REM PT RETURN 9FT ADLT (ELECTROSURGICAL) ×2
ELECTRODE REM PT RTRN 9FT ADLT (ELECTROSURGICAL) ×2 IMPLANT
GAUZE XEROFORM 5X9 LF (GAUZE/BANDAGES/DRESSINGS) ×1 IMPLANT
GLOVE BIOGEL M STRL SZ7.5 (GLOVE) ×6 IMPLANT
GLOVE INDICATOR 8.0 STRL GRN (GLOVE) ×6 IMPLANT
GLOVE SURG UNDER POLY LF SZ7.5 (GLOVE) ×3 IMPLANT
GOWN STRL REUS W/ TWL LRG LVL3 (GOWN DISPOSABLE) ×4 IMPLANT
GOWN STRL REUS W/TWL LRG LVL3 (GOWN DISPOSABLE) ×4
GOWN STRL REUS W/TWL XL LVL3 (GOWN DISPOSABLE) ×3 IMPLANT
GRAFT DERMACARRIERS 1 TO 1.5 (DISPOSABLE) IMPLANT
KIT BASIN OR (CUSTOM PROCEDURE TRAY) ×3 IMPLANT
KIT TURNOVER KIT B (KITS) ×3 IMPLANT
NS IRRIG 1000ML POUR BTL (IV SOLUTION) ×3 IMPLANT
PACK GENERAL/GYN (CUSTOM PROCEDURE TRAY) ×3 IMPLANT
PAD ARMBOARD 7.5X6 YLW CONV (MISCELLANEOUS) ×6 IMPLANT
STAPLER VISISTAT 35W (STAPLE) ×1 IMPLANT
SUT CHROMIC 4 0 PS 2 18 (SUTURE) ×1 IMPLANT
SUT SILK 3 0 SH CR/8 (SUTURE) ×4 IMPLANT
TOWEL GREEN STERILE (TOWEL DISPOSABLE) ×3 IMPLANT
TOWEL GREEN STERILE FF (TOWEL DISPOSABLE) ×3 IMPLANT
UNDERPAD 30X36 HEAVY ABSORB (UNDERPADS AND DIAPERS) ×3 IMPLANT

## 2022-02-20 NOTE — Transfer of Care (Signed)
Immediate Anesthesia Transfer of Care Note ? ?Patient: Leslie Mcknight ? ?Procedure(s) Performed: DEBRIDEMENT AND CLOSURE SCALP WOUND (Left: Face) ?SKIN GRAFT SPLIT THICKNESS (Left: Face) ? ?Patient Location: PACU ? ?Anesthesia Type:General ? ?Level of Consciousness: drowsy ? ?Airway & Oxygen Therapy: Patient Spontanous Breathing ? ?Post-op Assessment: Report given to RN and Post -op Vital signs reviewed and stable ? ?Post vital signs: Reviewed and stable ? ?Last Vitals:  ?Vitals Value Taken Time  ?BP 171/74 02/20/22 1311  ?Temp    ?Pulse 67 02/20/22 1314  ?Resp 14 02/20/22 1314  ?SpO2 100 % 02/20/22 1314  ?Vitals shown include unvalidated device data. ? ?Last Pain:  ?Vitals:  ? 02/20/22 1032  ?TempSrc:   ?PainSc: 0-No pain  ?   ? ?  ? ?Complications: No notable events documented. ?

## 2022-02-20 NOTE — Anesthesia Preprocedure Evaluation (Signed)
Anesthesia Evaluation  ?Patient identified by MRN, date of birth, ID band ?Patient awake ? ? ? ?Reviewed: ?Allergy & Precautions, NPO status  ? ?Airway ?Mallampati: II ? ?TM Distance: >3 FB ? ? ? ? Dental ?  ?Pulmonary ?sleep apnea ,  ?  ?breath sounds clear to auscultation ? ? ? ? ? ? Cardiovascular ?negative cardio ROS ? ? ?Rhythm:Regular Rate:Normal ? ? ?  ?Neuro/Psych ?PSYCHIATRIC DISORDERS negative neurological ROS ?   ? GI/Hepatic ?negative GI ROS, Neg liver ROS,   ?Endo/Other  ?negative endocrine ROS ? Renal/GU ?negative Renal ROS  ? ?  ?Musculoskeletal ? ? Abdominal ?  ?Peds ? Hematology ?  ?Anesthesia Other Findings ? ? Reproductive/Obstetrics ? ?  ? ? ? ? ? ? ? ? ? ? ? ? ? ?  ?  ? ? ? ? ? ? ? ? ?Anesthesia Physical ?Anesthesia Plan ? ?ASA: 3 ? ?Anesthesia Plan: General  ? ?Post-op Pain Management:   ? ?Induction: Intravenous ? ?PONV Risk Score and Plan: 3 and Ondansetron, Dexamethasone and Treatment may vary due to age or medical condition ? ?Airway Management Planned: Oral ETT ? ?Additional Equipment:  ? ?Intra-op Plan:  ? ?Post-operative Plan: Extubation in OR ? ?Informed Consent: I have reviewed the patients History and Physical, chart, labs and discussed the procedure including the risks, benefits and alternatives for the proposed anesthesia with the patient or authorized representative who has indicated his/her understanding and acceptance.  ? ? ?Discussed DNR with patient and Suspend DNR. ?  ?Dental advisory given ? ?Plan Discussed with: CRNA and Anesthesiologist ? ?Anesthesia Plan Comments:   ? ? ? ? ? ? ?Anesthesia Quick Evaluation ? ?

## 2022-02-20 NOTE — Op Note (Addendum)
Operative Note  ? ?DATE OF OPERATION: 02/20/2022 ? ?SURGICAL DEPARTMENT: Plastic Surgery ? ?PREOPERATIVE DIAGNOSES:  left temple squamous cell carcinoma ? ?POSTOPERATIVE DIAGNOSES:  same ? ?PROCEDURE:   ?4.2x4 cm debridement of left temple ?4.2x4 cm split thickness skin graft to left temple ? ?SURGEON: Melene Plan. Katelind Pytel, MD ? ?ASSISTANT: Krista Blue, PA ? ?ANESTHESIA:  General.  ? ?COMPLICATIONS: None.  ? ?INDICATIONS FOR PROCEDURE:  ?The patient, Leslie Mcknight is a 87 y.o. female born on 02/21/1935, is here for treatment of left temple squamous cell carcinoma. ?MRN: [id number] ? ?CONSENT:  ?Informed consent was obtained directly from the patient. Risks, benefits and alternatives were fully discussed. Specific risks including but not limited to bleeding, infection, hematoma, seroma, scarring, pain, contracture, asymmetry, wound healing problems, and need for further surgery were all discussed. The patient did have an ample opportunity to have questions answered to satisfaction.  ? ?DESCRIPTION OF PROCEDURE:  ?The patient was taken to the operating room. SCDs were placed and preop antibiotics were given. General anesthesia was administered.  The patient's operative site was prepped and draped in a sterile fashion. A time out was performed and all information was confirmed to be correct.   ? ?The area was irrigated with saline and then debrided with curette and guaze.  Bovie was used for hemostasis. After confirming hemostasis a graft was harvested at 15.  The graft was pie crusted.  The grafted was sutured in place with 4-0 Chromic gut and then a bolster dressing was sutured over that with xeroform, cotton balls and 3-0 silk. ? ?The PA assistant was essential for retraction as well as handling of tissues to allow contouring of skin graft. ? ?The patient tolerated the procedure well.  There were no complications. The patient was allowed to wake from anesthesia, extubated and taken to the recovery room in satisfactory  condition.   ?

## 2022-02-20 NOTE — Interval H&P Note (Signed)
History and Physical Interval Note: ? ?02/20/2022 ?10:56 AM ? ?Leslie Mcknight  has presented today for surgery, with the diagnosis of Squamous Cell [place]unk[place].  The various methods of treatment have been discussed with the patient and family. After consideration of risks, benefits and other options for treatment, the patient has consented to  Procedure(s) with comments: ?DEBRIDEMENT AND CLOSURE SCALP WOUND (Left) - 2 hours ?POSSIBLE ADJACENT TISSUE TRANSFER/TISSUE REARRANGEMENT (Left) ?POSSIBLE SKIN GRAFT SPLIT THICKNESS (Left) as a surgical intervention.  The patient's history has been reviewed, patient examined, no change in status, stable for surgery.  I have reviewed the patient's chart and labs.  Questions were answered to the patient's satisfaction.   ? ? ?Lennice Sites ? ? ?

## 2022-02-20 NOTE — Discharge Instructions (Addendum)
Activity: As tolerated, but avoid strenuous activity until follow up visit. ? ?Diet: Regular ? ?Wound Care: Your skin graft placement site was secured with a bolster using stitches. We then wrapped this with an ABD pad and Kerlix (around the head) to help with any bleeding.  Replace the ABD pad with gauze and re-dress as needed for bleeding.  Your skin graft harvest site (leg) was dressed with a clear plastic dressing. This area will be tender and sore.  If the dressing falls off, it is important that you keep it moist, clean, and covered. I recommend Vaseline and a nonstick adhesive pad secured or tape if the clear plastic dressing falls off. ? ?Special Instructions: ? ?Call our office if any unusual problems occur such as pain, excessive ?bleeding, unrelieved nausea/vomiting, fever &/or chills. ? ?Follow-up appointment: Scheduled for next week. ? ?Medications: Do not resume your aspirin until TOMORROW. Please take your pain medications, as managed by your skilled facility, as needed for pain symptoms.  They may also need to provide stool softeners or nausea medications as needed. ? ?

## 2022-02-20 NOTE — Anesthesia Procedure Notes (Signed)
Procedure Name: Intubation ?Date/Time: 02/20/2022 11:51 AM ?Performed by: Genelle Bal, CRNA ?Pre-anesthesia Checklist: Patient identified, Emergency Drugs available, Suction available and Patient being monitored ?Patient Re-evaluated:Patient Re-evaluated prior to induction ?Oxygen Delivery Method: Circle system utilized ?Preoxygenation: Pre-oxygenation with 100% oxygen ?Induction Type: IV induction ?Ventilation: Mask ventilation without difficulty ?Laryngoscope Size: Sabra Heck and 2 ?Grade View: Grade I ?Tube type: Oral ?Tube size: 7.0 mm ?Number of attempts: 1 ?Airway Equipment and Method: Stylet and Oral airway ?Placement Confirmation: ETT inserted through vocal cords under direct vision, positive ETCO2 and breath sounds checked- equal and bilateral ?Secured at: 21 cm ?Tube secured with: Tape ?Dental Injury: Teeth and Oropharynx as per pre-operative assessment  ? ? ? ? ?

## 2022-02-20 NOTE — Anesthesia Postprocedure Evaluation (Signed)
Anesthesia Post Note ? ?Patient: Leslie Mcknight ? ?Procedure(s) Performed: DEBRIDEMENT AND CLOSURE SCALP WOUND (Left: Face) ?SKIN GRAFT SPLIT THICKNESS (Left: Face) ? ?  ? ?Patient location during evaluation: PACU ?Anesthesia Type: General ?Level of consciousness: awake ?Pain management: pain level controlled ?Vital Signs Assessment: post-procedure vital signs reviewed and stable ?Respiratory status: spontaneous breathing ?Cardiovascular status: stable ?Postop Assessment: no apparent nausea or vomiting ?Anesthetic complications: no ? ? ?No notable events documented. ? ?Last Vitals:  ?Vitals:  ? 02/20/22 1325 02/20/22 1340  ?BP: (!) 152/84 (!) 133/92  ?Pulse: 72 70  ?Resp: 20 19  ?Temp:  36.5 ?C  ?SpO2: 99% 96%  ?  ?Last Pain:  ?Vitals:  ? 02/20/22 1340  ?TempSrc:   ?PainSc: 0-No pain  ? ? ?  ?  ?  ?  ?  ?  ? ?Antanasia Kaczynski ? ? ? ? ?

## 2022-02-21 ENCOUNTER — Encounter (HOSPITAL_COMMUNITY): Payer: Self-pay | Admitting: Plastic Surgery

## 2022-02-26 ENCOUNTER — Encounter: Payer: Self-pay | Admitting: Plastic Surgery

## 2022-02-26 ENCOUNTER — Ambulatory Visit (INDEPENDENT_AMBULATORY_CARE_PROVIDER_SITE_OTHER): Payer: Medicare Other | Admitting: Plastic Surgery

## 2022-02-26 DIAGNOSIS — C44329 Squamous cell carcinoma of skin of other parts of face: Secondary | ICD-10-CM

## 2022-02-26 DIAGNOSIS — C44320 Squamous cell carcinoma of skin of unspecified parts of face: Secondary | ICD-10-CM

## 2022-02-28 NOTE — Progress Notes (Signed)
Patient is status post skin graft to left temple region.  Doing well without complaints. ? ?Physical exam ?Skin graft intact healing well ? ?Assessment and plan ?We will see the patient back in another time to confirm satisfactory healing of the skin graft. ?

## 2022-03-12 ENCOUNTER — Ambulatory Visit (INDEPENDENT_AMBULATORY_CARE_PROVIDER_SITE_OTHER): Payer: Medicare Other | Admitting: Plastic Surgery

## 2022-03-12 DIAGNOSIS — C44329 Squamous cell carcinoma of skin of other parts of face: Secondary | ICD-10-CM

## 2022-03-12 DIAGNOSIS — C44320 Squamous cell carcinoma of skin of unspecified parts of face: Secondary | ICD-10-CM

## 2022-03-13 NOTE — Progress Notes (Signed)
Patient is status post left temporal skin graft.  Physical exam Skin graft intact, good contour  Assessment and plan Patient is doing well after grafting for left temple squamous cell carcinoma.  She will need to continue surveillance with her dermatologist.

## 2022-05-23 ENCOUNTER — Other Ambulatory Visit: Payer: Self-pay

## 2022-05-23 ENCOUNTER — Emergency Department (HOSPITAL_COMMUNITY)
Admission: EM | Admit: 2022-05-23 | Discharge: 2022-05-23 | Disposition: A | Discharge status: Skilled Nursing Facility | Payer: Medicare Other | Attending: Student | Admitting: Student

## 2022-05-23 ENCOUNTER — Emergency Department (HOSPITAL_COMMUNITY): Payer: Medicare Other

## 2022-05-23 ENCOUNTER — Encounter (HOSPITAL_COMMUNITY): Payer: Self-pay | Admitting: Emergency Medicine

## 2022-05-23 DIAGNOSIS — Z7982 Long term (current) use of aspirin: Secondary | ICD-10-CM | POA: Diagnosis not present

## 2022-05-23 DIAGNOSIS — K449 Diaphragmatic hernia without obstruction or gangrene: Secondary | ICD-10-CM | POA: Diagnosis not present

## 2022-05-23 DIAGNOSIS — F0390 Unspecified dementia without behavioral disturbance: Secondary | ICD-10-CM | POA: Diagnosis not present

## 2022-05-23 DIAGNOSIS — R0789 Other chest pain: Secondary | ICD-10-CM | POA: Diagnosis present

## 2022-05-23 LAB — COMPREHENSIVE METABOLIC PANEL
ALT: 17 U/L (ref 0–44)
AST: 21 U/L (ref 15–41)
Albumin: 3.4 g/dL — ABNORMAL LOW (ref 3.5–5.0)
Alkaline Phosphatase: 60 U/L (ref 38–126)
Anion gap: 5 (ref 5–15)
BUN: 16 mg/dL (ref 8–23)
CO2: 29 mmol/L (ref 22–32)
Calcium: 8.6 mg/dL — ABNORMAL LOW (ref 8.9–10.3)
Chloride: 108 mmol/L (ref 98–111)
Creatinine, Ser: 0.63 mg/dL (ref 0.44–1.00)
GFR, Estimated: >60 mL/min (ref >60)
Glucose, Bld: 96 mg/dL (ref 70–99)
Potassium: 3.6 mmol/L (ref 3.5–5.1)
Sodium: 142 mmol/L (ref 135–145)
Total Bilirubin: 0.7 mg/dL (ref 0.3–1.2)
Total Protein: 6.7 g/dL (ref 6.5–8.1)

## 2022-05-23 LAB — TROPONIN I (HIGH SENSITIVITY)
Troponin I (High Sensitivity): 5 ng/L (ref <18)
Troponin I (High Sensitivity): 6 ng/L (ref <18)

## 2022-05-23 LAB — CBC WITH DIFFERENTIAL/PLATELET
Abs Immature Granulocytes: 0.02 10*3/uL (ref 0.00–0.07)
Basophils Absolute: 0.1 10*3/uL (ref 0.0–0.1)
Basophils Relative: 1 %
Eosinophils Absolute: 0.2 10*3/uL (ref 0.0–0.5)
Eosinophils Relative: 4 %
HCT: 37.8 % (ref 36.0–46.0)
Hemoglobin: 12.1 g/dL (ref 12.0–15.0)
Immature Granulocytes: 0 %
Lymphocytes Relative: 13 %
Lymphs Abs: 0.9 10*3/uL (ref 0.7–4.0)
MCH: 31 pg (ref 26.0–34.0)
MCHC: 32 g/dL (ref 30.0–36.0)
MCV: 96.9 fL (ref 80.0–100.0)
Monocytes Absolute: 0.4 10*3/uL (ref 0.1–1.0)
Monocytes Relative: 6 %
Neutro Abs: 5 10*3/uL (ref 1.7–7.7)
Neutrophils Relative %: 76 %
Platelets: 223 10*3/uL (ref 150–400)
RBC: 3.9 MIL/uL (ref 3.87–5.11)
RDW: 13.7 % (ref 11.5–15.5)
WBC: 6.5 10*3/uL (ref 4.0–10.5)
nRBC: 0 % (ref 0.0–0.2)

## 2022-05-23 MED ORDER — IOHEXOL 350 MG/ML SOLN
75.0000 mL | Freq: Once | INTRAVENOUS | Status: AC | PRN
  Administered 2022-05-23: 75 mL via INTRAVENOUS

## 2022-05-23 MED ORDER — LIDOCAINE 5 % EX PTCH
1.0000 | MEDICATED_PATCH | CUTANEOUS | Status: DC
  Administered 2022-05-23: 1 via TRANSDERMAL
  Filled 2022-05-23: qty 1

## 2022-05-23 MED ORDER — LIDOCAINE VISCOUS HCL 2 % MT SOLN
15.0000 mL | Freq: Once | OROMUCOSAL | Status: AC
  Administered 2022-05-23: 15 mL via ORAL
  Filled 2022-05-23: qty 15

## 2022-05-23 MED ORDER — ALUM & MAG HYDROXIDE-SIMETH 200-200-20 MG/5ML PO SUSP
30.0000 mL | Freq: Once | ORAL | Status: AC
  Administered 2022-05-23: 30 mL via ORAL
  Filled 2022-05-23: qty 30

## 2022-05-23 MED ORDER — LIDOCAINE 5 % EX PTCH: 1.0000 | MEDICATED_PATCH | CUTANEOUS | 0 refills | Status: DC

## 2022-05-23 NOTE — ED Notes (Signed)
Changed and cleaned pt  

## 2022-05-23 NOTE — ED Notes (Signed)
Patient transported to x-ray. ?

## 2022-05-23 NOTE — ED Provider Notes (Signed)
[facility] EMERGENCY DEPARTMENT Provider Note  CSN: [id number] Arrival date & time: 05/23/22 5427  Chief Complaint(s) Chest Pain  HPI Leslie Mcknight is a 87 y.o. female with PMH anemia, dementia who presents emergency department for evaluation of chest pain.  Patient states that she woke this morning early in the morning and states that she "just did not feel right".  An ambulance was called to the [facility] unit in [place] where EMS found her to have an unremarkable ECG and was given aspirin and nitro prior to arrival.  She endorses persistent chest pain along the rib line on the left side of the chest.  Denies associated shortness of breath, nausea, vomiting, headache, fever, diaphoresis or other systemic symptoms.   Past Medical History Past Medical History:  Diagnosis Date   Anemia    Cognitive communication deficit    Dementia ([place])    HLD (hyperlipidemia)    MDD (major depressive disorder), single episode    Sleep apnea    does not use CPAP   UTI (urinary tract infection)    Patient Active Problem List   Diagnosis Date Noted   Leg swelling 05/15/2021   Closed comminuted intertrochanteric fracture of proximal end of left femur ([place]) 05/14/2021   Fall at home 05/14/2021   Depressive disorder    Dementia ([place]) 08/05/2020   Home Medication(s) Prior to Admission medications   Medication Sig Start Date End Date Taking? Authorizing Provider  aspirin EC 81 MG tablet Take 81 mg by mouth daily. Swallow whole.   Yes [provider]  atorvastatin (LIPITOR) 20 MG tablet Take 20 mg by mouth at bedtime.   Yes [provider]  docusate sodium (COLACE) 100 MG capsule Take 1 capsule (100 mg total) by mouth 2 (two) times daily. 05/18/21  Yes Amin, Ankit Chirag, MD  ferrous sulfate 325 (65 FE) MG tablet Take 325 mg by mouth daily with breakfast.   Yes [provider]  lidocaine (LIDODERM) 5 % Place 1 patch onto the skin daily. Remove & Discard patch  within 12 hours or as directed by MD 05/23/22  Yes Shammond Arave, MD  Multiple Vitamin (MULTIVITAMIN) tablet Take 1 tablet by mouth daily.   Yes [provider]  HYDROcodone-acetaminophen (NORCO/VICODIN) 5-325 MG tablet Take 1-2 tablets by mouth every 4 (four) hours as needed for moderate pain (pain score 4-6). Patient not taking: Reported on 05/23/2022 05/18/21   Damita Lack, MD                                                                                                                                    Past Surgical History Past Surgical History:  Procedure Laterality Date   DEBRIDEMENT AND CLOSURE WOUND Left 02/20/2022   Procedure: DEBRIDEMENT AND CLOSURE SCALP WOUND;  Surgeon: Lennice Sites, MD;  Location: [facility];  Service: Plastics;  Laterality: Left;  2 hours   INTRAMEDULLARY (IM) NAIL  INTERTROCHANTERIC Left 05/15/2021   Procedure: INTRAMEDULLARY (IM) NAIL INTERTROCHANTRIC;  Surgeon: Rod Can, MD;  Location: [facility];  Service: Orthopedics;  Laterality: Left;   SKIN SPLIT GRAFT Left 02/20/2022   Procedure: SKIN GRAFT SPLIT THICKNESS;  Surgeon: Lennice Sites, MD;  Location: [facility];  Service: Plastics;  Laterality: Left;   Family History History reviewed. No pertinent family history.  Social History Social History   Tobacco Use   Smoking status: Never   Smokeless tobacco: Never  Vaping Use   Vaping Use: Never used  Substance Use Topics   Alcohol use: Never   Drug use: Never   Allergies Patient has no known allergies.  Review of Systems Review of Systems  Cardiovascular:  Positive for chest pain.    Physical Exam Vital Signs  I have reviewed the triage vital signs BP (!) 163/63   Pulse 65   Temp 97.8 F (36.6 C) (Oral)   Resp (!) 22   Ht '4\' 11"'$  (1.499 m)   Wt 46.7 kg   LMP  (LMP Unknown)   SpO2 97%   BMI 20.79 kg/m   Physical Exam Vitals and nursing note reviewed.  Constitutional:      General: She is not in acute distress.    Appearance:  She is well-developed.  HENT:     Head: Normocephalic and atraumatic.  Eyes:     Conjunctiva/sclera: Conjunctivae normal.  Cardiovascular:     Rate and Rhythm: Normal rate and regular rhythm.     Heart sounds: No murmur heard. Pulmonary:     Effort: Pulmonary effort is normal. No respiratory distress.     Breath sounds: Normal breath sounds.  Chest:     Chest wall: Tenderness present.  Abdominal:     Palpations: Abdomen is soft.     Tenderness: There is no abdominal tenderness.  Musculoskeletal:        General: No swelling.     Cervical back: Neck supple.  Skin:    General: Skin is warm and dry.     Capillary Refill: Capillary refill takes less than 2 seconds.  Neurological:     Mental Status: She is alert.  Psychiatric:        Mood and Affect: Mood normal.     ED Results and Treatments Labs (all labs ordered are listed, but only abnormal results are displayed) Labs Reviewed  COMPREHENSIVE METABOLIC PANEL - Abnormal; Notable for the following components:      Result Value   Calcium 8.6 (*)    Albumin 3.4 (*)    All other components within normal limits  CBC WITH DIFFERENTIAL/PLATELET  TROPONIN I (HIGH SENSITIVITY)  TROPONIN I (HIGH SENSITIVITY)                                                                                                                          Radiology CT Angio Chest PE W and/or Wo Contrast  Result Date: 05/23/2022 CLINICAL DATA:  Chest pain EXAM: CT ANGIOGRAPHY  CHEST WITH CONTRAST TECHNIQUE: Multidetector CT imaging of the chest was performed using the standard protocol during bolus administration of intravenous contrast. Multiplanar CT image reconstructions and MIPs were obtained to evaluate the vascular anatomy. RADIATION DOSE REDUCTION: This exam was performed according to the departmental dose-optimization program which includes automated exposure control, adjustment of the mA and/or kV according to patient size and/or use of iterative  reconstruction technique. CONTRAST:  59m OMNIPAQUE IOHEXOL 350 MG/ML SOLN COMPARISON:  None Available. FINDINGS: Cardiovascular: No evidence of pulmonary embolus. Thin linear filling defect of the interlobar pulmonary artery is consistent with streak artifact. Normal caliber ascending thoracic aorta measuring 3.6 cm on series 6, image 127. Cardiomegaly. Trace pericardial fluid. Mediastinum/Nodes: Moderate hiatal hernia and moderate circumferential wall thickening of the lower esophagus. Thyroid is unremarkable. No pathologically enlarged lymph nodes seen in the chest. Lungs/Pleura: Central airways are patent. Bibasilar atelectasis. No consolidation, pleural effusion or pneumothorax. Upper Abdomen: Scattered low-attenuation liver lesions, largest in the right hepatic lobe is compatible with a simple cysts, others are too small to completely characterize. Bilateral parapelvic cysts. Area of cortical irregularity with associated calcifications seen in the lower pole of the left kidney. No hydronephrosis. Musculoskeletal: Moderate age indeterminate compression deformities of T3 and T8. No aggressive appearing osseous lesions. Review of the MIP images confirms the above findings. IMPRESSION: 1. No evidence of pulmonary embolus or acute airspace opacity. 2. Moderate sized hiatal hernia. Moderate circumferential wall thickening in the lower thoracic esophagus, most commonly due to reflux esophagitis. If there is clinical concern for Barrett's esophagus or esophageal neoplasm, GI referral for upper endoscopic correlation is recommended. 3. Area of cortical irregularity with associated calcifications seen in the lower pole of the left kidney. Possibly due to scarring, although renal mass is not excluded. Nonemergent contrast-enhanced MRI could be performed for further evaluation. Alternatively, if outside priors are available, comparison could be performed to confirm stability. 4. Moderate age indeterminate compression  deformities of T3 and T8. Correlate for point tenderness. 5. Cardiomegaly and aortic Atherosclerosis (ICD10-I70.0). Electronically Signed   By: LYetta GlassmanM.D.   On: 05/23/2022 10:03   DG Chest 2 View  Result Date: 05/23/2022 CLINICAL DATA:  Chest pain. EXAM: CHEST - 2 VIEW COMPARISON:  Chest x-ray August 04, 2020. FINDINGS: Biapical pleuroparenchymal scarring. No consolidation. No visible pleural effusions or pneumothorax. Cardiomediastinal silhouette is enlarged, similar prior. Polyarticular degenerative change and osteopenia. IMPRESSION: No evidence of acute cardiopulmonary disease. Electronically Signed   By: FMargaretha SheffieldM.D.   On: 05/23/2022 09:20    Pertinent labs & imaging results that were available during my care of the patient were reviewed by me and considered in my medical decision making (see MDM for details).  Medications Ordered in ED Medications  lidocaine (LIDODERM) 5 % 1 patch (1 patch Transdermal Patch Applied 05/23/22 1115)  iohexol (OMNIPAQUE) 350 MG/ML injection 75 mL (75 mLs Intravenous Contrast Given 05/23/22 0936)  alum & mag hydroxide-simeth (MAALOX/MYLANTA) 200-200-20 MG/5ML suspension 30 mL (30 mLs Oral Given 05/23/22 1114)    And  lidocaine (XYLOCAINE) 2 % viscous mouth solution 15 mL (15 mLs Oral Given 05/23/22 1114)  Procedures Procedures  (including critical care time)  Medical Decision Making / ED Course   This patient presents to the ED for concern of chest pain, this involves an extensive number of treatment options, and is a complaint that carries with it a high risk of complications and morbidity.  The differential diagnosis includes ACS PE, rib fracture, costochondritis, pneumonia, gastritis  MDM: Patient seen emergency room for evaluation of chest pain.  Physical exam reveals reproducible chest pain over the rib line  on the right under the nipple.  Patient unremarkable including negative troponin and delta troponin.  Chest x-ray unremarkable.  CT PE obtained that shows a moderate size hiatal hernia with esophageal thickening concerning for gastritis, age-indeterminate T-spine fractures and an indeterminate renal cortical irregularity that will need to be followed outpatient with an abdominal MRI.  Patient was given GI cocktail and lidocaine patch over the area in question and on reevaluation her pain has completely resolved.  Suspect that patient's pain is likely related to costochondritis versus contusion from her falls at home, and when asked about esophageal reflux or difficulty swallowing/issues with vomiting patient denies any of these.  Patient's heart score is 3 and that she is safe for discharge with outpatient cardiology follow-up.  A cardiology referral was placed and patient discharged back to her facility.   Additional history obtained:  -External records from outside source obtained and reviewed including: Chart review including previous notes, labs, imaging, consultation notes   Lab Tests: -I ordered, reviewed, and interpreted labs.   The pertinent results include:   Labs Reviewed  COMPREHENSIVE METABOLIC PANEL - Abnormal; Notable for the following components:      Result Value   Calcium 8.6 (*)    Albumin 3.4 (*)    All other components within normal limits  CBC WITH DIFFERENTIAL/PLATELET  TROPONIN I (HIGH SENSITIVITY)  TROPONIN I (HIGH SENSITIVITY)      EKG   EKG Interpretation  Date/Time:  Wednesday May 23 2022 08:52:47 EDT Ventricular Rate:  55 PR Interval:  181 QRS Duration: 90 QT Interval:  413 QTC Calculation: 395 R Axis:   119 Text Interpretation: Sinus rhythm Probable anterior infarct, old Confirmed by [place] ([id number]) on 05/23/2022 10:11:14 AM         Imaging Studies ordered: I ordered imaging studies including chest x-ray, CT PE I independently visualized  and interpreted imaging. I agree with the radiologist interpretation   Medicines ordered and prescription drug management: Meds ordered this encounter  Medications   iohexol (OMNIPAQUE) 350 MG/ML injection 75 mL   lidocaine (LIDODERM) 5 % 1 patch   AND Linked Order Group    alum & mag hydroxide-simeth (MAALOX/MYLANTA) 200-200-20 MG/5ML suspension 30 mL    lidocaine (XYLOCAINE) 2 % viscous mouth solution 15 mL   lidocaine (LIDODERM) 5 %    Sig: Place 1 patch onto the skin daily. Remove & Discard patch within 12 hours or as directed by MD    Dispense:  30 patch    Refill:  0    -I have reviewed the patients home medicines and have made adjustments as needed  Critical interventions NONE  Cardiac Monitoring: The patient was maintained on a cardiac monitor.  I personally viewed and interpreted the cardiac monitored which showed an underlying rhythm of: NSR  Social Determinants of Health:  Factors impacting patients care include: none   Reevaluation: After the interventions noted above, I reevaluated the patient and found that they have :improved  Co morbidities that complicate  the patient evaluation  Past Medical History:  Diagnosis Date   Anemia    Cognitive communication deficit    Dementia ([place])    HLD (hyperlipidemia)    MDD (major depressive disorder), single episode    Sleep apnea    does not use CPAP   UTI (urinary tract infection)       Dispostion: I considered admission for this patient, but she currently does not meet inpatient criteria for admission is safe for discharge with outpatient follow-up     Final Clinical Impression(s) / ED Diagnoses Final diagnoses:  Chest wall pain     '@PCDICTATION'$ @    Milo Solana, Debe Coder, MD 05/23/22 1524

## 2022-05-23 NOTE — ED Triage Notes (Signed)
Pt to the ED via [facility] from [facility] in [place] with a complaint of chest pain.  EMS reports ECG was unremarkable and her pain increases with palpation.  Pt was given aspirin and nitro prior to arrival.

## 2022-07-05 IMAGING — CT CT CERVICAL SPINE W/O CM
3 of 4 series · 13 of 33 positions shown, 16 images · non-contrast
Comparison: None.

CLINICAL DATA: Neck pain after unwitnessed fall.

EXAM:
CT HEAD WITHOUT CONTRAST
CT CERVICAL SPINE WITHOUT CONTRAST
TECHNIQUE: Multidetector CT imaging of the head and cervical spine was
performed following the standard protocol without intravenous
contrast. Multiplanar CT image reconstructions of the cervical spine
were also generated.

[Series 9: c spine soft · axial · 0.27mm/px · z∈[-188,-70]mm · 6 of 77 slices shown, 8 images]
[im 12/77  soft-tissue]
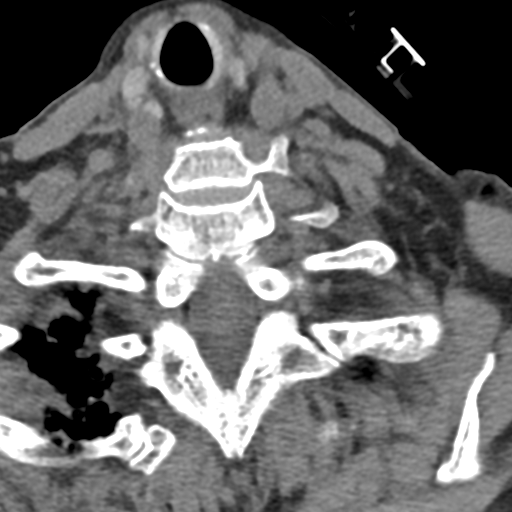
[im 12/77  bone]
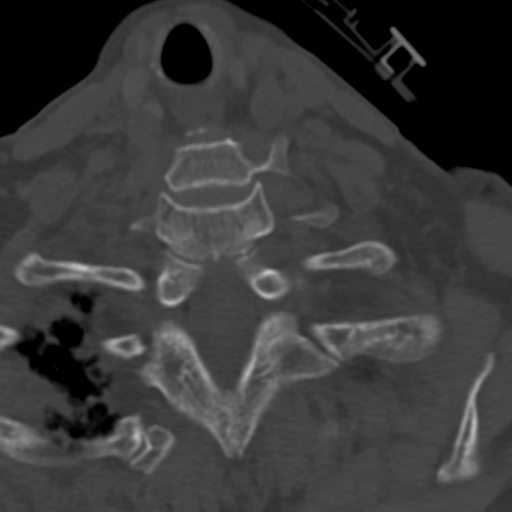
[im 24/77  bone]
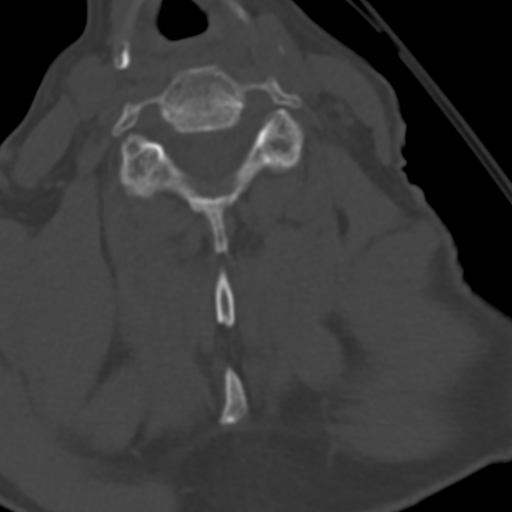
[im 36/77  bone]
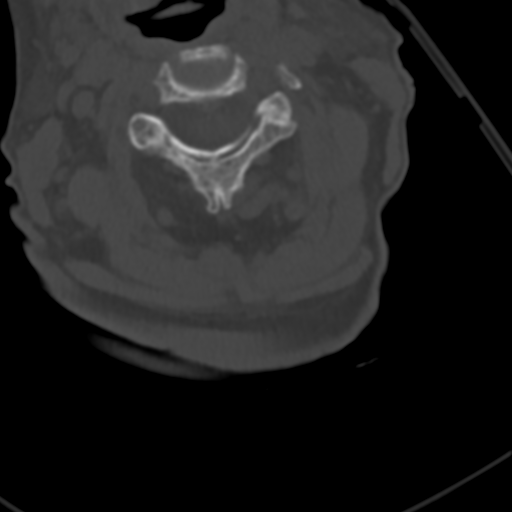
[im 47/77  bone]
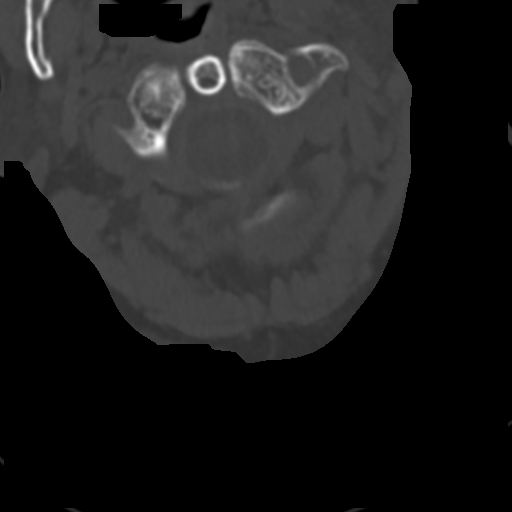
[im 59/77  soft-tissue]
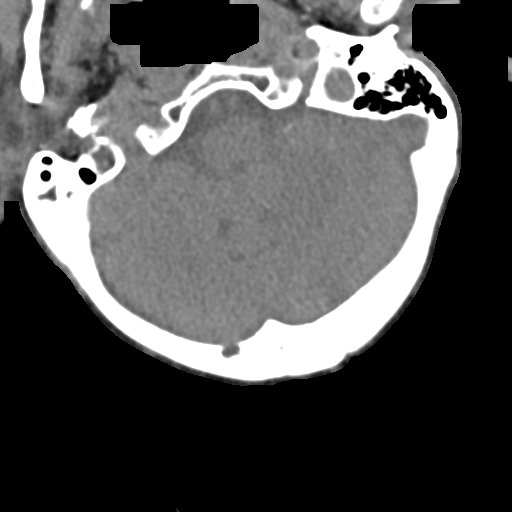
[im 59/77  bone]
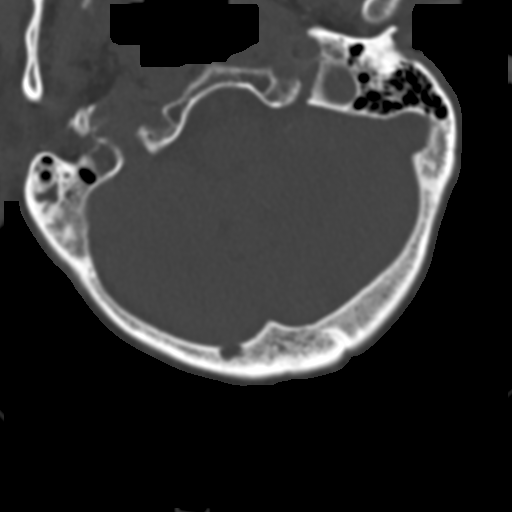
[im 71/77  bone]
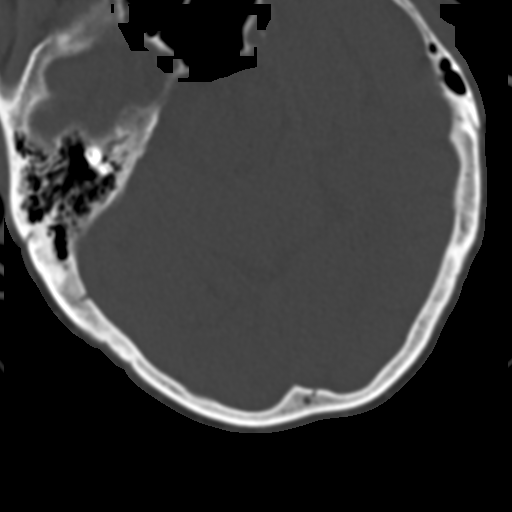

[Series 10: sagittal bone · sagittal · 0.33mm/px · 5 of 61 slices shown, 6 images]
[im 21/61  bone]
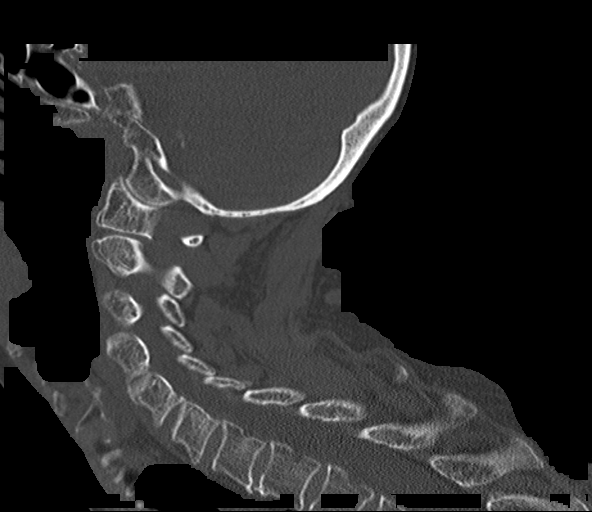
[im 26/61  bone]
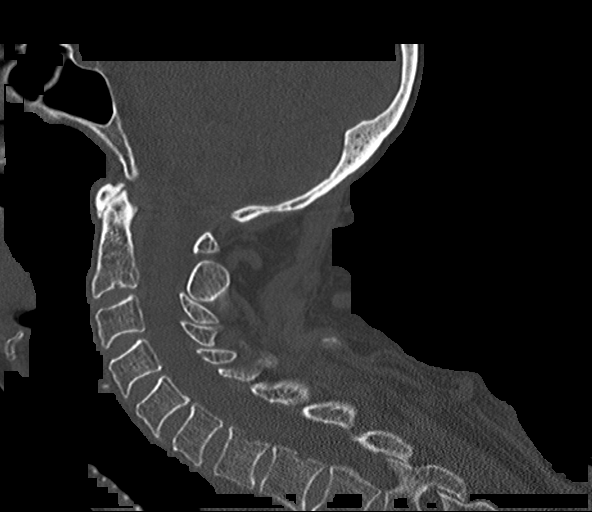
[im 31/61  soft-tissue]
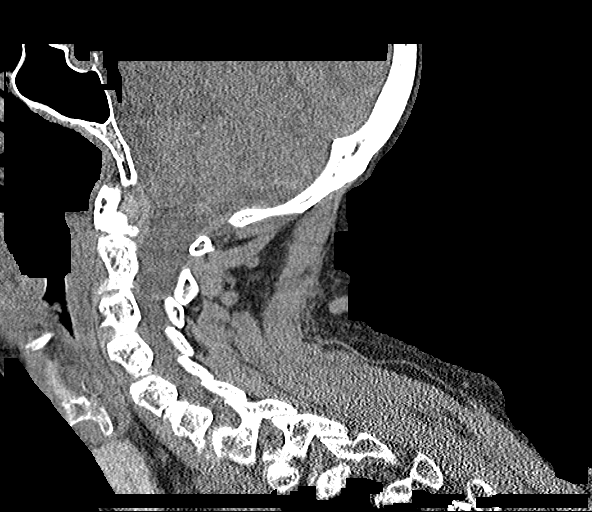
[im 31/61  bone]
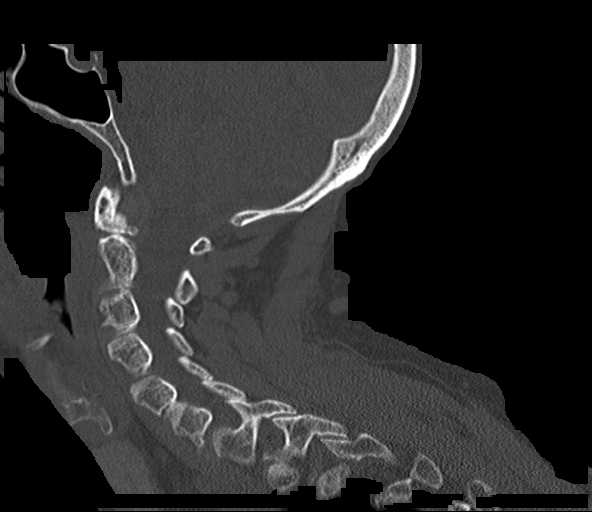
[im 36/61  bone]
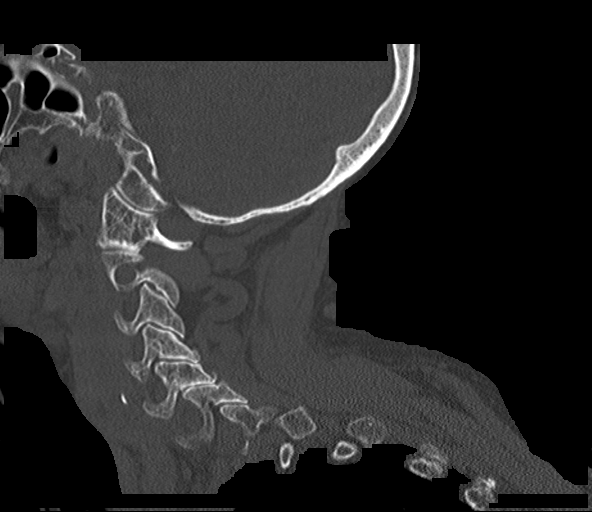
[im 41/61  bone]
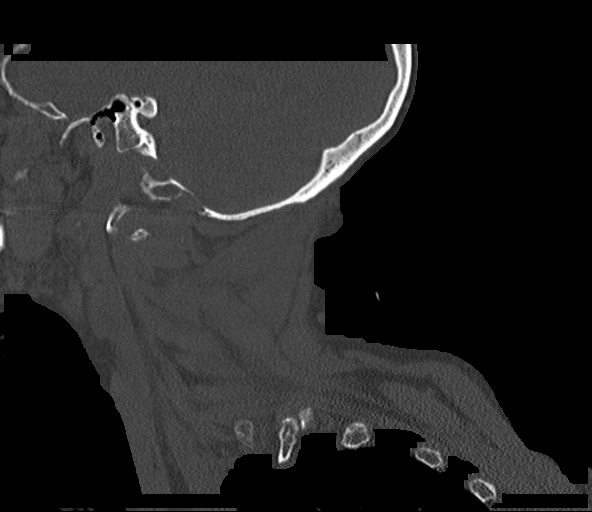

[Series 11: coronal bone · coronal · 0.23mm/px · 2 of 64 slices shown]
[im 22/64  bone]
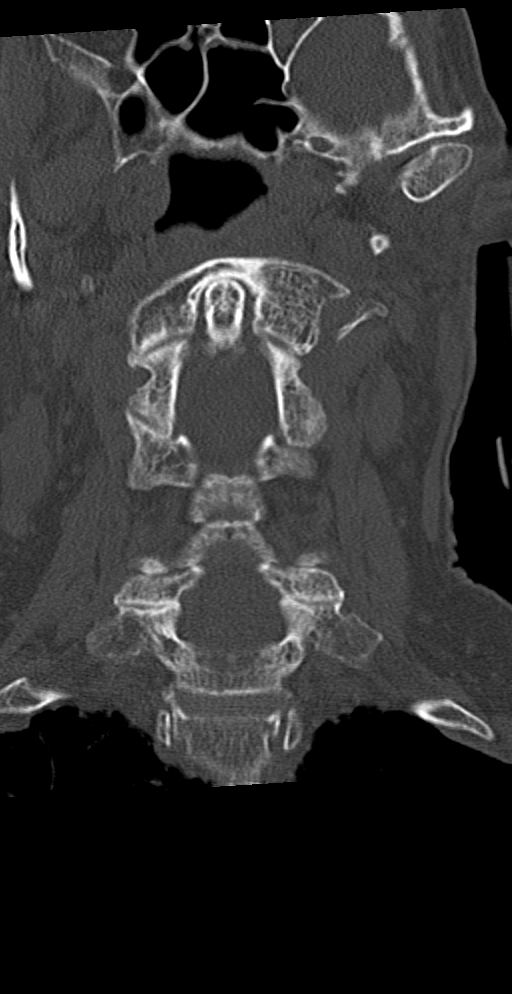
[im 43/64  bone]
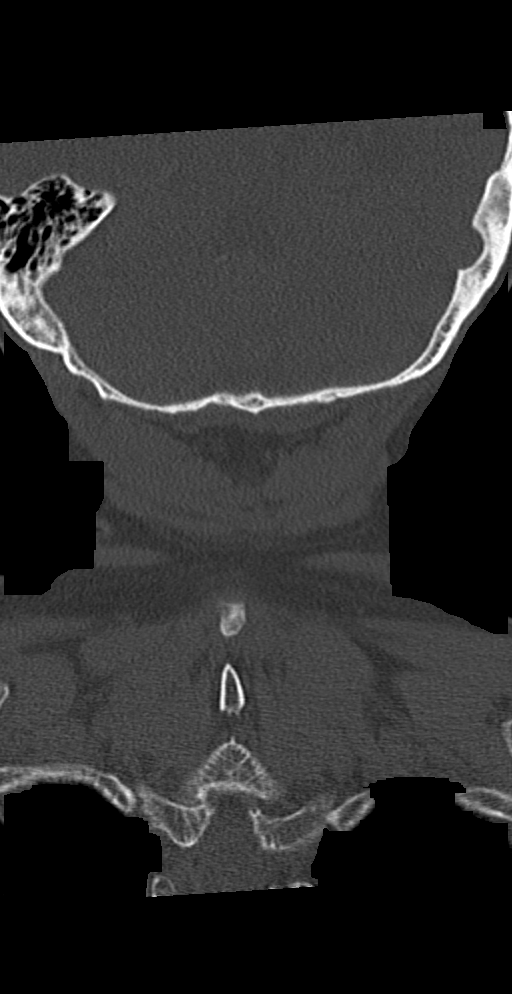

[13 of 33 positions shown; findings below may reference images not displayed]

FINDINGS: CT HEAD FINDINGS

Brain: Mild diffuse cortical atrophy is noted. Mild chronic ischemic
white matter disease is noted. No mass effect or midline shift is
noted. Ventricular size is within normal limits. There is no
evidence of mass lesion, hemorrhage or acute infarction.

Vascular: No hyperdense vessel or unexpected calcification.

Skull: Normal. Negative for fracture or focal lesion.

Sinuses/Orbits: No acute finding.

Other: None.

CT CERVICAL SPINE FINDINGS

Alignment: Normal.

Skull base and vertebrae: No acute fracture. No primary bone lesion
or focal pathologic process.

Soft tissues and spinal canal: No prevertebral fluid or swelling. No
visible canal hematoma.

Disc levels:  Normal.

Upper chest: Negative.

Other: None.
IMPRESSION: No acute intracranial abnormality seen.

No significant abnormality seen in the cervical spine.

## 2022-08-02 ENCOUNTER — Other Ambulatory Visit: Payer: Self-pay

## 2022-08-02 ENCOUNTER — Emergency Department (HOSPITAL_COMMUNITY): Payer: Medicare Other

## 2022-08-02 ENCOUNTER — Encounter (HOSPITAL_COMMUNITY): Payer: Self-pay

## 2022-08-02 ENCOUNTER — Emergency Department (HOSPITAL_COMMUNITY)
Admission: EM | Admit: 2022-08-02 | Discharge: 2022-08-02 | Disposition: A | Discharge status: Skilled Nursing Facility | Payer: Medicare Other | Attending: Emergency Medicine | Admitting: Emergency Medicine

## 2022-08-02 DIAGNOSIS — Y92129 Unspecified place in nursing home as the place of occurrence of the external cause: Secondary | ICD-10-CM | POA: Diagnosis not present

## 2022-08-02 DIAGNOSIS — S0003XA Contusion of scalp, initial encounter: Secondary | ICD-10-CM

## 2022-08-02 DIAGNOSIS — W01198A Fall on same level from slipping, tripping and stumbling with subsequent striking against other object, initial encounter: Secondary | ICD-10-CM | POA: Diagnosis not present

## 2022-08-02 DIAGNOSIS — Z7982 Long term (current) use of aspirin: Secondary | ICD-10-CM | POA: Diagnosis not present

## 2022-08-02 DIAGNOSIS — S0990XA Unspecified injury of head, initial encounter: Secondary | ICD-10-CM | POA: Diagnosis present

## 2022-08-02 DIAGNOSIS — S0101XA Laceration without foreign body of scalp, initial encounter: Secondary | ICD-10-CM | POA: Insufficient documentation

## 2022-08-02 DIAGNOSIS — F0390 Unspecified dementia without behavioral disturbance: Secondary | ICD-10-CM | POA: Insufficient documentation

## 2022-08-02 DIAGNOSIS — M79641 Pain in right hand: Secondary | ICD-10-CM | POA: Insufficient documentation

## 2022-08-02 DIAGNOSIS — M25521 Pain in right elbow: Secondary | ICD-10-CM | POA: Diagnosis not present

## 2022-08-02 NOTE — ED Notes (Signed)
Called [facility] multiple times in attempt to give report without success.

## 2022-08-02 NOTE — ED Notes (Addendum)
Pt assisted with sitting up in bed @ 90 degrees, pt given tea and cup of diced pears, pt ate and drank 100% Pt is pleasantly confused with hx dementia.

## 2022-08-02 NOTE — ED Notes (Signed)
Called facility again to inquire on transportation- report transport has been called and they are on the way.

## 2022-08-02 NOTE — ED Notes (Signed)
This nurse called [facility] and spoke with Juliann Pulse, Administrator-reports that they don't have transport at this time, she will have to call someone in, asked if pt was ready- informed that pt is ready at this time.

## 2022-08-02 NOTE — ED Provider Notes (Addendum)
[facility] EMERGENCY DEPARTMENT Provider Note   CSN: [id number] Arrival date & time: 08/02/22  1335     History Chief Complaint  Patient presents with   Leslie Mcknight Leslie Mcknight is a 87 y.o. female with history of dementia who presents to the emergency department today after mechanical fall.  History is limited secondary to patient's baseline mental status.  She did states that she slipped and fell. She complains of right elbow pain and posterior head pain.  I spoke with staff at [facility] and she was attempting to turn when she lost her balance and fell backwards.  Staff state that when she fell backwards and struck her head her head bounced off the ground.   Fall       Home Medications Prior to Admission medications   Medication Sig Start Date End Date Taking? Authorizing Provider  aspirin EC 81 MG tablet Take 81 mg by mouth daily. Swallow whole.    [provider]  atorvastatin (LIPITOR) 20 MG tablet Take 20 mg by mouth at bedtime.    [provider]  docusate sodium (COLACE) 100 MG capsule Take 1 capsule (100 mg total) by mouth 2 (two) times daily. 05/18/21   Amin, Jeanella Flattery, MD  ferrous sulfate 325 (65 FE) MG tablet Take 325 mg by mouth daily with breakfast.    [provider]  HYDROcodone-acetaminophen (NORCO/VICODIN) 5-325 MG tablet Take 1-2 tablets by mouth every 4 (four) hours as needed for moderate pain (pain score 4-6). Patient not taking: Reported on 05/23/2022 05/18/21   Damita Lack, MD  lidocaine (LIDODERM) 5 % Place 1 patch onto the skin daily. Remove & Discard patch within 12 hours or as directed by MD 05/23/22   Kommor, Debe Coder, MD  Multiple Vitamin (MULTIVITAMIN) tablet Take 1 tablet by mouth daily.    [provider]      Allergies    Patient has no known allergies.    Review of Systems   Review of Systems  All other systems reviewed and are negative.   Physical Exam Updated Vital Signs BP (!)  176/100 (BP Location: Left Arm)   Pulse 67   Temp 98.3 F (36.8 C) (Oral)   Resp 18   LMP  (LMP Unknown)   SpO2 99%  Physical Exam Vitals and nursing note reviewed.  Constitutional:      Appearance: Normal appearance.  HENT:     Head: Normocephalic and atraumatic.   Eyes:     General:        Right eye: No discharge.        Left eye: No discharge.     Conjunctiva/sclera: Conjunctivae normal.  Pulmonary:     Effort: Pulmonary effort is normal.  Musculoskeletal:     Comments: Right elbow pain.  Skin:    General: Skin is warm and dry.     Findings: No rash.  Neurological:     General: No focal deficit present.     Mental Status: She is alert.  Psychiatric:        Mood and Affect: Mood normal.        Behavior: Behavior normal.     ED Results / Procedures / Treatments   Labs (all labs ordered are listed, but only abnormal results are displayed) Labs Reviewed - No data to display  EKG None  Radiology CT Head Wo Contrast  Result Date: 08/02/2022 CLINICAL DATA:  Trauma hit head EXAM: CT HEAD WITHOUT CONTRAST CT  CERVICAL SPINE WITHOUT CONTRAST TECHNIQUE: Multidetector CT imaging of the head and cervical spine was performed following the standard protocol without intravenous contrast. Multiplanar CT image reconstructions of the cervical spine were also generated. RADIATION DOSE REDUCTION: This exam was performed according to the departmental dose-optimization program which includes automated exposure control, adjustment of the mA and/or kV according to patient size and/or use of iterative reconstruction technique. COMPARISON:  CT brain and cervical spine 12/08/2021 chest CT 05/23/2022 FINDINGS: CT HEAD FINDINGS Brain: No acute territorial infarction, hemorrhage, or intracranial mass. Atrophy. Mild chronic small vessel ischemic changes of the white matter. Stable ventricle size. Vascular: No hyperdense vessels.  Carotid vascular calcification Skull: Normal. Negative for fracture or  focal lesion. Sinuses/Orbits: No acute finding. Other: Posterior scalp hematoma CT CERVICAL SPINE FINDINGS Alignment: Trace retrolisthesis C2 on C3 and C3 on C4. Facet alignment within normal limits. Skull base and vertebrae: Mild superior endplate deformity/compression fracture at C7, uncertain age but new since August. Moderate severe compression fracture T3, chronic but progressed loss of height since August 2023 chest CT. About 3 mm retropulsion of upper vertebral body into the spinal canal. Remaining vertebral bodies demonstrate normal stature Soft tissues and spinal canal: No prevertebral fluid or swelling. No visible canal hematoma. Disc levels:  Mild multilevel degenerative change. Upper chest: Apical scarring Other: None IMPRESSION: 1. No CT evidence for acute intracranial abnormality. Atrophy and chronic small vessel ischemic changes of the white matter. Posterior scalp hematoma 2. Age indeterminate mild superior endplate deformity and wedging at C7 3. Chronic compression fracture T3 but increased loss of height since chest CT from August of 2023 Electronically Signed   By: Donavan Foil M.D.   On: 08/02/2022 16:13   CT Cervical Spine Wo Contrast  Result Date: 08/02/2022 CLINICAL DATA:  Trauma hit head EXAM: CT HEAD WITHOUT CONTRAST CT CERVICAL SPINE WITHOUT CONTRAST TECHNIQUE: Multidetector CT imaging of the head and cervical spine was performed following the standard protocol without intravenous contrast. Multiplanar CT image reconstructions of the cervical spine were also generated. RADIATION DOSE REDUCTION: This exam was performed according to the departmental dose-optimization program which includes automated exposure control, adjustment of the mA and/or kV according to patient size and/or use of iterative reconstruction technique. COMPARISON:  CT brain and cervical spine 12/08/2021 chest CT 05/23/2022 FINDINGS: CT HEAD FINDINGS Brain: No acute territorial infarction, hemorrhage, or intracranial  mass. Atrophy. Mild chronic small vessel ischemic changes of the white matter. Stable ventricle size. Vascular: No hyperdense vessels.  Carotid vascular calcification Skull: Normal. Negative for fracture or focal lesion. Sinuses/Orbits: No acute finding. Other: Posterior scalp hematoma CT CERVICAL SPINE FINDINGS Alignment: Trace retrolisthesis C2 on C3 and C3 on C4. Facet alignment within normal limits. Skull base and vertebrae: Mild superior endplate deformity/compression fracture at C7, uncertain age but new since August. Moderate severe compression fracture T3, chronic but progressed loss of height since August 2023 chest CT. About 3 mm retropulsion of upper vertebral body into the spinal canal. Remaining vertebral bodies demonstrate normal stature Soft tissues and spinal canal: No prevertebral fluid or swelling. No visible canal hematoma. Disc levels:  Mild multilevel degenerative change. Upper chest: Apical scarring Other: None IMPRESSION: 1. No CT evidence for acute intracranial abnormality. Atrophy and chronic small vessel ischemic changes of the white matter. Posterior scalp hematoma 2. Age indeterminate mild superior endplate deformity and wedging at C7 3. Chronic compression fracture T3 but increased loss of height since chest CT from August of 2023 Electronically Signed   By:  Donavan Foil M.D.   On: 08/02/2022 16:13   DG Shoulder Right  Result Date: 08/02/2022 CLINICAL DATA:  Right shoulder pain status post fall EXAM: RIGHT SHOULDER - 3 VIEW COMPARISON:  None Available. FINDINGS: No evidence of acute fracture or dislocation. Osseous deformity of the distal clavicle, likely due to prior fracture. Mild degenerative changes of the acromioclavicular joint. Diffuse demineralization. Soft tissues are unremarkable. IMPRESSION: No acute osseous abnormality. Electronically Signed   By: Yetta Glassman M.D.   On: 08/02/2022 15:41   DG Elbow Complete Right  Result Date: 08/02/2022 CLINICAL DATA:  Fall  EXAM: RIGHT ELBOW - COMPLETE 3 VIEW COMPARISON:  None Available. FINDINGS: There is no evidence of fracture, dislocation, or joint effusion. There is no evidence of arthropathy or other focal bone abnormality. Soft tissues are unremarkable. IMPRESSION: Negative. Electronically Signed   By: Franchot Gallo M.D.   On: 08/02/2022 15:40    Procedures .Marland KitchenLaceration Repair  Date/Time: 08/02/2022 4:35 PM  Performed by: Hendricks Limes, PA-C Authorized by: Hendricks Limes, PA-C   Consent:    Consent obtained:  Emergent situation   Consent given by:  Patient   Risks discussed:  Infection Universal protocol:    Procedure explained and questions answered to patient or proxy's satisfaction: yes     Relevant documents present and verified: yes     Test results available: yes     Imaging studies available: yes     Required blood products, implants, devices, and special equipment available: yes     Site/side marked: no     Immediately prior to procedure, a time out was called: no     Patient identity confirmed:  Arm band Anesthesia:    Anesthesia method:  None Laceration details:    Location:  Scalp   Scalp location:  Occipital   Length (cm):  3 Pre-procedure details:    Preparation:  Patient was prepped and draped in usual sterile fashion Exploration:    Limited defect created (wound extended): no     Hemostasis achieved with:  Direct pressure   Imaging outcome: foreign body not noted     Wound exploration: wound explored through full range of motion and entire depth of wound visualized     Wound extent: no areolar tissue violation noted, no fascia violation noted, no foreign bodies/material noted, no muscle damage noted, no nerve damage noted, no tendon damage noted, no underlying fracture noted and no vascular damage noted   Treatment:    Area cleansed with:  Saline and soap and water   Amount of cleaning:  Extensive   Visualized foreign bodies/material removed: no     Debridement:   None   Undermining:  None   Scar revision: no   Skin repair:    Repair method:  Staples   Number of staples:  2 Approximation:    Approximation:  Close Repair type:    Repair type:  Simple Post-procedure details:    Dressing:  Open (no dressing)   Procedure completion:  Tolerated well, no immediate complications     Medications Ordered in ED Medications - No data to display  ED Course/ Medical Decision Making/ A&P Clinical Course as of 08/02/22 1641  Thu Aug 02, 2022  1619 DG Elbow Complete Right I personally ordered interpret the study which is negative for any fracture or dislocation.  I do agree with the radiologist interpretation. [CF]  1619 DG Shoulder Right I personally ordered interpret the study which is negative for any  fracture or dislocation.  I do agree with the radiologist interpretation. [CF]  9147 I personally ordered and interpreted a CT scan of the head and cervical spine.  I do not see any signs of intracranial hemorrhage.  There is signs of hematoma on the occiput.  No signs of acute fracture in the spine.  I do agree with radiologist interpretation. [CF]  8295 After cleaning up occipital area, there is a 2cm laceration evident. Bleeding controlled.  [CF]    Clinical Course User Index [CF] Hendricks Limes, PA-C                           Medical Decision Making SHARDEE DIEU is a 87 y.o. female patient who presents to the emergency department today for further evaluation of a mechanical trip and fall and subsequent hematoma.  We will get CT scans to evaluate in addition to films of the right shoulder and right elbow.  History is certainly limited secondary to the patient's baseline mental status.  She is in no acute distress at this time.  No significant signs of intracranial hemorrhage or neck fracture on imaging.  Shoulder and elbow films were also normal.  I repaired the small laceration on the occipital region of the head with staples.  Please see  procedure note above for further detail.  Patient able to go home.  She is reasonable with this plan.  Strict return precautions were discussed.  She is safe for discharge.   Amount and/or Complexity of Data Reviewed Radiology: ordered. Decision-making details documented in ED Course.   Final Clinical Impression(s) / ED Diagnoses Final diagnoses:  Laceration of scalp, initial encounter  Hematoma of scalp, initial encounter    Rx / DC Orders ED Discharge Orders     None         Hendricks Limes, PA-C 08/02/22 1640    Hendricks Limes, PA-C 08/02/22 1641    Sherwood Gambler, MD 08/03/22 1505

## 2022-08-02 NOTE — ED Notes (Signed)
Pt had wet brief, soaked through to pants, pants removed and placed in pt bag, placed new brief on pt and paper scrub bottoms- pt taken via WC to [facility] transport, a/w outside, discharge paperwork given to staff and pt soiled pants given to staff. Pt assisted in car by this nurse.

## 2022-08-02 NOTE — ED Notes (Signed)
Continue to call report to [facility] with no answer.

## 2022-08-02 NOTE — ED Notes (Signed)
Continue to call ALF with no answer.

## 2022-08-02 NOTE — ED Triage Notes (Signed)
Patient BIB EMS from [facility] for fall. States that patient slipped and fell hitting posterior head on floor. Noted with laceration that currently has pressure dressing in place and bleeding controlled. Complaining of right elbow/amrpain. No LOC. Dementia at baseline per EMS.

## 2022-08-02 NOTE — ED Notes (Signed)
Patient confused, trying to get OOB. Re-directed multiple times. States that she is going to walk home. Refusing to eat dinner.

## 2022-08-02 NOTE — Discharge Instructions (Signed)
Staples will need to be removed in 7 to 10 days.  These can be removed in the emergency department, urgent care, or primary care physician.  Ice over the area for the next 2 to 3 days.  Then can switch to heat. Please return to the ED for any worsening symptoms you might have.

## 2022-08-06 NOTE — Progress Notes (Signed)
CARDIOLOGY CONSULT NOTE       Patient ID: Leslie Mcknight MRN: [id number] DOB/AGE: 30-Oct-1934 87 y.o.  Admit date: (Not on file) Referring Physician: Chest Pain Primary Physician: Bonnita Nasuti, MD Primary Cardiologist: New Reason for Consultation: Chest pain  Active Problems:   * No active hospital problems. *   HPI:  87 y.o. demented resident of [place] Referred by Dr London Pepper for chest pain Seen in [facility] ED 08/02/22 after mechanical fall and scalp laceration Seen prior to this 05/23/22 for chest pain. Awoke 5:00 am didn't feel right. Persistent chest pain along left ribs R/O ECG non acute CTA r/o PE with moderate size hiatal hernia and esophageal thickening Pain resolved with GI cocktail and lidocaine patch over left ribs Review of ER records shows she falls quite a bit   Severe dementia. No complaints No chest pain Aid with her indicates she gets out of wheel chair and falls Family visits 2x/month Poor dentition Good appetite no dyspnea or edema   ROS All other systems reviewed and negative except as noted above  Past Medical History:  Diagnosis Date   Anemia    Cognitive communication deficit    Dementia ([place])    HLD (hyperlipidemia)    MDD (major depressive disorder), single episode    Sleep apnea    does not use CPAP   UTI (urinary tract infection)     No family history on file.  Social History   Socioeconomic History   Marital status: Widowed    Spouse name: Not on file   Number of children: Not on file   Years of education: Not on file   Highest education level: Not on file  Occupational History   Not on file  Tobacco Use   Smoking status: Never   Smokeless tobacco: Never  Vaping Use   Vaping Use: Never used  Substance and Sexual Activity   Alcohol use: Never   Drug use: Never   Sexual activity: Not Currently    Birth control/protection: Post-menopausal  Other Topics Concern   Not on file  Social History Narrative   Not on file   Social  Determinants of Health   Financial Resource Strain: Not on file  Food Insecurity: Not on file  Transportation Needs: Not on file  Physical Activity: Not on file  Stress: Not on file  Social Connections: Not on file  Intimate Partner Violence: Not on file    Past Surgical History:  Procedure Laterality Date   DEBRIDEMENT AND CLOSURE WOUND Left 02/20/2022   Procedure: DEBRIDEMENT AND CLOSURE SCALP WOUND;  Surgeon: Lennice Sites, MD;  Location: [facility];  Service: Plastics;  Laterality: Left;  2 hours   INTRAMEDULLARY (IM) NAIL INTERTROCHANTERIC Left 05/15/2021   Procedure: INTRAMEDULLARY (IM) NAIL INTERTROCHANTRIC;  Surgeon: Rod Can, MD;  Location: [facility];  Service: Orthopedics;  Laterality: Left;   SKIN SPLIT GRAFT Left 02/20/2022   Procedure: SKIN GRAFT SPLIT THICKNESS;  Surgeon: Lennice Sites, MD;  Location: [facility];  Service: Plastics;  Laterality: Left;      Current Outpatient Medications:    aspirin EC 81 MG tablet, Take 81 mg by mouth daily. Swallow whole., Disp: , Rfl:    atorvastatin (LIPITOR) 20 MG tablet, Take 20 mg by mouth at bedtime., Disp: , Rfl:    docusate sodium (COLACE) 100 MG capsule, Take 1 capsule (100 mg total) by mouth 2 (two) times daily., Disp: 10 capsule, Rfl: 0   ferrous sulfate 325 (65 FE) MG  tablet, Take 325 mg by mouth daily with breakfast., Disp: , Rfl:    HYDROcodone-acetaminophen (NORCO/VICODIN) 5-325 MG tablet, Take 1-2 tablets by mouth every 4 (four) hours as needed for moderate pain (pain score 4-6). (Patient not taking: Reported on 05/23/2022), Disp: 15 tablet, Rfl: 0   lidocaine (LIDODERM) 5 %, Place 1 patch onto the skin daily. Remove & Discard patch within 12 hours or as directed by MD, Disp: 30 patch, Rfl: 0   Multiple Vitamin (MULTIVITAMIN) tablet, Take 1 tablet by mouth daily., Disp: , Rfl:     Physical Exam: There were no vitals taken for this visit.    Affect appropriate Chronically ill elderly female with dementia HEENT: normal Neck  supple with no adenopathy JVP normal no bruits no thyromegaly Lungs clear with no wheezing and good diaphragmatic motion Heart:  S1/S2 no murmur, no rub, gallop or click PMI normal Abdomen: benighn, BS positve, no tenderness, no AAA no bruit.  No HSM or HJR Distal pulses intact with no bruits No edema Neuro non-focal Skin warm and dry No muscular weakness   Labs:   Lab Results  Component Value Date   WBC 6.5 05/23/2022   HGB 12.1 05/23/2022   HCT 37.8 05/23/2022   MCV 96.9 05/23/2022   PLT 223 05/23/2022   No results for input(s): "NA", "K", "CL", "CO2", "BUN", "CREATININE", "CALCIUM", "PROT", "BILITOT", "ALKPHOS", "ALT", "AST", "GLUCOSE" in the last 168 hours.  Invalid input(s): "LABALBU" Lab Results  Component Value Date   CKTOTAL 202 05/14/2021   No results found for: "CHOL" No results found for: "HDL" No results found for: "[place]" No results found for: "TRIG" No results found for: "CHOLHDL" No results found for: "LDLDIRECT"    Radiology: CT Head Wo Contrast  Result Date: 08/02/2022 CLINICAL DATA:  Trauma hit head EXAM: CT HEAD WITHOUT CONTRAST CT CERVICAL SPINE WITHOUT CONTRAST TECHNIQUE: Multidetector CT imaging of the head and cervical spine was performed following the standard protocol without intravenous contrast. Multiplanar CT image reconstructions of the cervical spine were also generated. RADIATION DOSE REDUCTION: This exam was performed according to the departmental dose-optimization program which includes automated exposure control, adjustment of the mA and/or kV according to patient size and/or use of iterative reconstruction technique. COMPARISON:  CT brain and cervical spine 12/08/2021 chest CT 05/23/2022 FINDINGS: CT HEAD FINDINGS Brain: No acute territorial infarction, hemorrhage, or intracranial mass. Atrophy. Mild chronic small vessel ischemic changes of the white matter. Stable ventricle size. Vascular: No hyperdense vessels.  Carotid vascular  calcification Skull: Normal. Negative for fracture or focal lesion. Sinuses/Orbits: No acute finding. Other: Posterior scalp hematoma CT CERVICAL SPINE FINDINGS Alignment: Trace retrolisthesis C2 on C3 and C3 on C4. Facet alignment within normal limits. Skull base and vertebrae: Mild superior endplate deformity/compression fracture at C7, uncertain age but new since August. Moderate severe compression fracture T3, chronic but progressed loss of height since August 2023 chest CT. About 3 mm retropulsion of upper vertebral body into the spinal canal. Remaining vertebral bodies demonstrate normal stature Soft tissues and spinal canal: No prevertebral fluid or swelling. No visible canal hematoma. Disc levels:  Mild multilevel degenerative change. Upper chest: Apical scarring Other: None IMPRESSION: 1. No CT evidence for acute intracranial abnormality. Atrophy and chronic small vessel ischemic changes of the white matter. Posterior scalp hematoma 2. Age indeterminate mild superior endplate deformity and wedging at C7 3. Chronic compression fracture T3 but increased loss of height since chest CT from August of 2023 Electronically Signed   By:  Donavan Foil M.D.   On: 08/02/2022 16:13   CT Cervical Spine Wo Contrast  Result Date: 08/02/2022 CLINICAL DATA:  Trauma hit head EXAM: CT HEAD WITHOUT CONTRAST CT CERVICAL SPINE WITHOUT CONTRAST TECHNIQUE: Multidetector CT imaging of the head and cervical spine was performed following the standard protocol without intravenous contrast. Multiplanar CT image reconstructions of the cervical spine were also generated. RADIATION DOSE REDUCTION: This exam was performed according to the departmental dose-optimization program which includes automated exposure control, adjustment of the mA and/or kV according to patient size and/or use of iterative reconstruction technique. COMPARISON:  CT brain and cervical spine 12/08/2021 chest CT 05/23/2022 FINDINGS: CT HEAD FINDINGS Brain: No  acute territorial infarction, hemorrhage, or intracranial mass. Atrophy. Mild chronic small vessel ischemic changes of the white matter. Stable ventricle size. Vascular: No hyperdense vessels.  Carotid vascular calcification Skull: Normal. Negative for fracture or focal lesion. Sinuses/Orbits: No acute finding. Other: Posterior scalp hematoma CT CERVICAL SPINE FINDINGS Alignment: Trace retrolisthesis C2 on C3 and C3 on C4. Facet alignment within normal limits. Skull base and vertebrae: Mild superior endplate deformity/compression fracture at C7, uncertain age but new since August. Moderate severe compression fracture T3, chronic but progressed loss of height since August 2023 chest CT. About 3 mm retropulsion of upper vertebral body into the spinal canal. Remaining vertebral bodies demonstrate normal stature Soft tissues and spinal canal: No prevertebral fluid or swelling. No visible canal hematoma. Disc levels:  Mild multilevel degenerative change. Upper chest: Apical scarring Other: None IMPRESSION: 1. No CT evidence for acute intracranial abnormality. Atrophy and chronic small vessel ischemic changes of the white matter. Posterior scalp hematoma 2. Age indeterminate mild superior endplate deformity and wedging at C7 3. Chronic compression fracture T3 but increased loss of height since chest CT from August of 2023 Electronically Signed   By: Donavan Foil M.D.   On: 08/02/2022 16:13   DG Shoulder Right  Result Date: 08/02/2022 CLINICAL DATA:  Right shoulder pain status post fall EXAM: RIGHT SHOULDER - 3 VIEW COMPARISON:  None Available. FINDINGS: No evidence of acute fracture or dislocation. Osseous deformity of the distal clavicle, likely due to prior fracture. Mild degenerative changes of the acromioclavicular joint. Diffuse demineralization. Soft tissues are unremarkable. IMPRESSION: No acute osseous abnormality. Electronically Signed   By: Yetta Glassman M.D.   On: 08/02/2022 15:41   DG Elbow Complete  Right  Result Date: 08/02/2022 CLINICAL DATA:  Fall EXAM: RIGHT ELBOW - COMPLETE 3 VIEW COMPARISON:  None Available. FINDINGS: There is no evidence of fracture, dislocation, or joint effusion. There is no evidence of arthropathy or other focal bone abnormality. Soft tissues are unremarkable. IMPRESSION: Negative. Electronically Signed   By: Franchot Gallo M.D.   On: 08/02/2022 15:40    EKG: SR rate 55 poor R wave progression   ASSESSMENT AND PLAN:   Chest Pain: atypical r/o no acute ECG changes resolved with GI cocktail and lidocain patch Frequent falls likely relating to left sided rib pain and significant hiatal hernia could also cause chest pain Given age and dementia no further ischemic evaluation warranted GI:  f/u primary regarding esophageal thickening seen on CTA HLD  on statin Dementia:  in memory unit  Anemia:  on iron replacement Hct stable 37.8  F/U cardiology PRN  Signed: Jenkins Rouge 08/06/2022, 5:35 PM

## 2022-08-10 ENCOUNTER — Encounter: Payer: Self-pay | Admitting: Cardiovascular Disease

## 2022-08-10 ENCOUNTER — Ambulatory Visit: Payer: Medicare Other | Attending: Cardiovascular Disease | Admitting: Cardiovascular Disease

## 2022-08-10 VITALS — BP 108/74 | HR 70 | Ht 59.0 in | Wt 101.0 lb

## 2022-08-10 DIAGNOSIS — R079 Chest pain, unspecified: Secondary | ICD-10-CM | POA: Diagnosis present

## 2022-08-10 DIAGNOSIS — F0150 Vascular dementia without behavioral disturbance: Secondary | ICD-10-CM | POA: Diagnosis not present

## 2022-08-10 NOTE — Patient Instructions (Signed)
Medication Instructions:  Your physician recommends that you continue on your current medications as directed. Please refer to the Current Medication list given to you today.  *If you need a refill on your cardiac medications before your next appointment, please call your pharmacy*   Lab Work: NONE   If you have labs (blood work) drawn today and your tests are completely normal, you will receive your results only by: [facility] (if you have MyChart) OR A paper copy in the mail If you have any lab test that is abnormal or we need to change your treatment, we will call you to review the results.   Testing/Procedures: NONE    Follow-Up: At [facility], you and your health needs are our priority.  As part of our continuing mission to provide you with exceptional heart care, we have created designated Provider Care Teams.  These Care Teams include your primary Cardiologist (physician) and Advanced Practice Providers (APPs -  Physician Assistants and Nurse Practitioners) who all work together to provide you with the care you need, when you need it.  We recommend signing up for the patient portal called "MyChart".  Sign up information is provided on this After Visit Summary.  MyChart is used to connect with patients for Virtual Visits (Telemedicine).  Patients are able to view lab/test results, encounter notes, upcoming appointments, etc.  Non-urgent messages can be sent to your provider as well.   To learn more about what you can do with MyChart, go to [email or web address].    Your next appointment:    As Needed   The format for your next appointment:   In Person  Provider:   Jenkins Rouge, MD    Other Instructions Thank you for choosing [facility]!    Important Information About Sugar

## 2022-11-26 ENCOUNTER — Emergency Department (HOSPITAL_COMMUNITY): Payer: Medicare Other

## 2022-11-26 ENCOUNTER — Emergency Department (HOSPITAL_COMMUNITY)
Admission: EM | Admit: 2022-11-26 | Discharge: 2022-11-27 | Disposition: A | Discharge status: Home / Self Care | Payer: Medicare Other | Attending: Emergency Medicine | Admitting: Emergency Medicine

## 2022-11-26 ENCOUNTER — Other Ambulatory Visit: Payer: Self-pay

## 2022-11-26 ENCOUNTER — Encounter (HOSPITAL_COMMUNITY): Payer: Self-pay | Admitting: *Deleted

## 2022-11-26 DIAGNOSIS — F0390 Unspecified dementia without behavioral disturbance: Secondary | ICD-10-CM | POA: Diagnosis not present

## 2022-11-26 DIAGNOSIS — S0003XA Contusion of scalp, initial encounter: Secondary | ICD-10-CM | POA: Insufficient documentation

## 2022-11-26 DIAGNOSIS — W19XXXA Unspecified fall, initial encounter: Secondary | ICD-10-CM | POA: Diagnosis not present

## 2022-11-26 DIAGNOSIS — Z7982 Long term (current) use of aspirin: Secondary | ICD-10-CM | POA: Insufficient documentation

## 2022-11-26 DIAGNOSIS — S0990XA Unspecified injury of head, initial encounter: Secondary | ICD-10-CM | POA: Diagnosis present

## 2022-11-26 DIAGNOSIS — S0093XA Contusion of unspecified part of head, initial encounter: Secondary | ICD-10-CM

## 2022-11-26 MED ORDER — LORAZEPAM 2 MG/ML IJ SOLN
0.5000 mg | Freq: Once | INTRAMUSCULAR | Status: AC
  Administered 2022-11-27: 0.5 mg via INTRAMUSCULAR
  Filled 2022-11-26: qty 1

## 2022-11-26 MED ORDER — ACETAMINOPHEN 325 MG PO TABS
650.0000 mg | ORAL_TABLET | Freq: Once | ORAL | Status: AC
  Administered 2022-11-26: 650 mg via ORAL
  Filled 2022-11-26: qty 2

## 2022-11-26 MED ORDER — LORAZEPAM 0.5 MG PO TABS
0.5000 mg | ORAL_TABLET | Freq: Once | ORAL | Status: DC
  Filled 2022-11-26: qty 1

## 2022-11-26 NOTE — ED Triage Notes (Signed)
Pt brought in by [facility] for c/o fall; pt has hematoma to back of head; pt has dementia and is not oriented to how she fell

## 2022-11-26 NOTE — ED Notes (Signed)
Pt was able to ambulate to the BR with assistance

## 2022-11-26 NOTE — ED Provider Notes (Signed)
[facility] Provider Note   CSN: [id number] Arrival date & time: 11/26/22  1516     History  Chief Complaint  Patient presents with   Leslie Mcknight is a 88 y.o. female.  Pt is a 88 yo female with pmhx significant for dementia, depression, HLD, and anemia.  Pt is on the memory care unit and fell, hitting the back of her head.  Pt is not on thinners.  She does not remember what happened.  She has pain to the back of her head.  Pt is at baseline ms per EMS.       Home Medications Prior to Admission medications   Medication Sig Start Date End Date Taking? Authorizing Provider  aspirin EC 81 MG tablet Take 81 mg by mouth daily. Swallow whole.    [provider]  atorvastatin (LIPITOR) 20 MG tablet Take 20 mg by mouth at bedtime.    [provider]  docusate sodium (COLACE) 100 MG capsule Take 1 capsule (100 mg total) by mouth 2 (two) times daily. 05/18/21   Amin, Jeanella Flattery, MD  ferrous sulfate 325 (65 FE) MG tablet Take 325 mg by mouth daily with breakfast.    [provider]  HYDROcodone-acetaminophen (NORCO/VICODIN) 5-325 MG tablet Take 1-2 tablets by mouth every 4 (four) hours as needed for moderate pain (pain score 4-6). 05/18/21   Amin, Ankit Chirag, MD  lidocaine (LIDODERM) 5 % Place 1 patch onto the skin daily. Remove & Discard patch within 12 hours or as directed by MD 05/23/22   Kommor, Debe Coder, MD  Multiple Vitamin (MULTIVITAMIN) tablet Take 1 tablet by mouth daily.    [provider]      Allergies    Patient has no known allergies.    Review of Systems   Review of Systems  Unable to perform ROS: Dementia  Neurological:  Positive for headaches.  All other systems reviewed and are negative.   Physical Exam Updated Vital Signs BP (!) 184/77 (BP Location: Left Arm)   Pulse 62   Temp 98 F (36.7 C) (Oral)   Resp 16   Wt 48.3 kg   LMP  (LMP Unknown)   SpO2 100%   BMI 21.51  kg/m  Physical Exam Vitals and nursing note reviewed.  Constitutional:      Appearance: Normal appearance.  HENT:     Head: Normocephalic.     Comments: Hematoma back of head    Right Ear: External ear normal.     Left Ear: External ear normal.     Nose: Nose normal.     Mouth/Throat:     Mouth: Mucous membranes are moist.     Pharynx: Oropharynx is clear.  Eyes:     Extraocular Movements: Extraocular movements intact.     Conjunctiva/sclera: Conjunctivae normal.     Pupils: Pupils are equal, round, and reactive to light.  Cardiovascular:     Rate and Rhythm: Normal rate and regular rhythm.     Pulses: Normal pulses.     Heart sounds: Normal heart sounds.  Pulmonary:     Effort: Pulmonary effort is normal.     Breath sounds: Normal breath sounds.  Abdominal:     General: Abdomen is flat. Bowel sounds are normal.     Palpations: Abdomen is soft.  Musculoskeletal:        General: Normal range of motion.     Cervical back: Normal range of  motion and neck supple.  Skin:    General: Skin is warm.     Capillary Refill: Capillary refill takes less than 2 seconds.  Neurological:     General: No focal deficit present.     Mental Status: She is alert. Mental status is at baseline.  Psychiatric:        Mood and Affect: Mood normal.        Behavior: Behavior normal.     ED Results / Procedures / Treatments   Labs (all labs ordered are listed, but only abnormal results are displayed) Labs Reviewed - No data to display  EKG None  Radiology DG Chest 2 View  Result Date: 11/26/2022 CLINICAL DATA:  Fall. EXAM: CHEST - 2 VIEW COMPARISON:  May 23, 2022. FINDINGS: Mild cardiomegaly. No acute pulmonary disease is noted. Stable old thoracic compression fractures are noted. IMPRESSION: No active cardiopulmonary disease. Electronically Signed   By: Marijo Conception M.D.   On: 11/26/2022 16:21   DG Pelvis 1-2 Views  Result Date: 11/26/2022 CLINICAL DATA:  Dementia.  Fall. EXAM:  PELVIS - 1-2 VIEW COMPARISON:  05/29/2021 FINDINGS: Previous ORIF of a left femur fracture with solid union. No acute fracture seen in the region. IMPRESSION: No acute finding. Previous ORIF of a left femur fracture with solid union. Electronically Signed   By: Nelson Chimes M.D.   On: 11/26/2022 16:21   CT Head Wo Contrast  Result Date: 11/26/2022 CLINICAL DATA:  Polytrauma, blunt; Neck trauma (Age >= 65y) EXAM: CT HEAD WITHOUT CONTRAST CT CERVICAL SPINE WITHOUT CONTRAST TECHNIQUE: Multidetector CT imaging of the head and cervical spine was performed following the standard protocol without intravenous contrast. Multiplanar CT image reconstructions of the cervical spine were also generated. RADIATION DOSE REDUCTION: This exam was performed according to the departmental dose-optimization program which includes automated exposure control, adjustment of the mA and/or kV according to patient size and/or use of iterative reconstruction technique. COMPARISON:  08/02/2022, 09/12/2022 FINDINGS: CT HEAD FINDINGS Brain: No evidence of acute infarction, hemorrhage, hydrocephalus, extra-axial collection or mass lesion/mass effect. Patchy low-density changes within the periventricular and subcortical white matter compatible with chronic microvascular ischemic change. Mild diffuse cerebral volume loss. Vascular: Atherosclerotic calcifications involving the large vessels of the skull base. No unexpected hyperdense vessel. Skull: Normal. Negative for fracture or focal lesion. Sinuses/Orbits: No acute finding. Other: Soft tissue swelling of the high left parietal scalp. CT CERVICAL SPINE FINDINGS Alignment: Facet joints are aligned without dislocation or traumatic listhesis. Dens and lateral masses are aligned. Skull base and vertebrae: Moderate superior endplate compression deformity of C7 without progression of height loss compared to prior. Moderate-severe compression deformity at T3 is unchanged. Subtle superior endplate  compression deformity at T4 is unchanged. No evidence of acute fracture. No suspicious bone lesion. Soft tissues and spinal canal: No prevertebral fluid or swelling. No visible canal hematoma. Disc levels: Similar degree of facet predominant cervical spondylosis. Upper chest: Aortic atherosclerosis.  No acute abnormality. Other: None. IMPRESSION: 1. No acute intracranial abnormality. 2. Soft tissue swelling of the high left parietal scalp without evidence of underlying calvarial fracture. 3. No acute fracture or traumatic subluxation of the cervical spine. 4. Chronic compression deformities of C7, T3, and T4 without progressive height loss compared to the previous CT. She Aortic Atherosclerosis (ICD10-I70.0). Electronically Signed   By: Davina Poke D.O.   On: 11/26/2022 16:13   CT Cervical Spine Wo Contrast  Result Date: 11/26/2022 CLINICAL DATA:  Polytrauma, blunt; Neck trauma (Age >=  65y) EXAM: CT HEAD WITHOUT CONTRAST CT CERVICAL SPINE WITHOUT CONTRAST TECHNIQUE: Multidetector CT imaging of the head and cervical spine was performed following the standard protocol without intravenous contrast. Multiplanar CT image reconstructions of the cervical spine were also generated. RADIATION DOSE REDUCTION: This exam was performed according to the departmental dose-optimization program which includes automated exposure control, adjustment of the mA and/or kV according to patient size and/or use of iterative reconstruction technique. COMPARISON:  08/02/2022, 09/12/2022 FINDINGS: CT HEAD FINDINGS Brain: No evidence of acute infarction, hemorrhage, hydrocephalus, extra-axial collection or mass lesion/mass effect. Patchy low-density changes within the periventricular and subcortical white matter compatible with chronic microvascular ischemic change. Mild diffuse cerebral volume loss. Vascular: Atherosclerotic calcifications involving the large vessels of the skull base. No unexpected hyperdense vessel. Skull: Normal.  Negative for fracture or focal lesion. Sinuses/Orbits: No acute finding. Other: Soft tissue swelling of the high left parietal scalp. CT CERVICAL SPINE FINDINGS Alignment: Facet joints are aligned without dislocation or traumatic listhesis. Dens and lateral masses are aligned. Skull base and vertebrae: Moderate superior endplate compression deformity of C7 without progression of height loss compared to prior. Moderate-severe compression deformity at T3 is unchanged. Subtle superior endplate compression deformity at T4 is unchanged. No evidence of acute fracture. No suspicious bone lesion. Soft tissues and spinal canal: No prevertebral fluid or swelling. No visible canal hematoma. Disc levels: Similar degree of facet predominant cervical spondylosis. Upper chest: Aortic atherosclerosis.  No acute abnormality. Other: None. IMPRESSION: 1. No acute intracranial abnormality. 2. Soft tissue swelling of the high left parietal scalp without evidence of underlying calvarial fracture. 3. No acute fracture or traumatic subluxation of the cervical spine. 4. Chronic compression deformities of C7, T3, and T4 without progressive height loss compared to the previous CT. She Aortic Atherosclerosis (ICD10-I70.0). Electronically Signed   By: Davina Poke D.O.   On: 11/26/2022 16:13    Procedures Procedures    Medications Ordered in ED Medications  acetaminophen (TYLENOL) tablet 650 mg (has no administration in time range)    ED Course/ Medical Decision Making/ A&P                             Medical Decision Making Amount and/or Complexity of Data Reviewed Radiology: ordered.  Risk OTC drugs.   This patient presents to the ED for concern of fall, this involves an extensive number of treatment options, and is a complaint that carries with it a high risk of complications and morbidity.  The differential diagnosis includes multiple trauma   Co morbidities that complicate the patient evaluation  dementia,  depression, HLD, and anemia   Additional history obtained:  Additional history obtained from epic chart review External records from outside source obtained and reviewed including EMS report  Imaging Studies ordered:  I ordered imaging studies including ct head/c-spine, cxr, pelvis  I independently visualized and interpreted imaging which showed  CT head/c-spine: No acute intracranial abnormality.  2. Soft tissue swelling of the high left parietal scalp without  evidence of underlying calvarial fracture.  3. No acute fracture or traumatic subluxation of the cervical spine.  4. Chronic compression deformities of C7, T3, and T4 without  progressive height loss compared to the previous CT. She    Aortic Atherosclerosis (ICD10-I70.0).   CXR: No active cardiopulmonary disease.  Pelvis: No acute finding. Previous ORIF of a left femur fracture with solid  union.   I agree with the radiologist interpretation  Medicines ordered and prescription drug management:  I ordered medication including tylenol  for pain  Reevaluation of the patient after these medicines showed that the patient improved I have reviewed the patients home medicines and have made adjustments as needed   Test Considered:  ct   Critical Interventions:  ct  Problem List / ED Course:  Fall:  no significant injury.  Pt is able to ambulate.  She is stable for d/c.  Return if worse.    Reevaluation:  After the interventions noted above, I reevaluated the patient and found that they have :improved   Social Determinants of Health:  Lives in memory care   Dispostion:  After consideration of the diagnostic results and the patients response to treatment, I feel that the patent would benefit from discharge with outpatient f/u.          Final Clinical Impression(s) / ED Diagnoses Final diagnoses:  Fall, initial encounter  Traumatic cephalohematoma, initial encounter    Rx / DC Orders ED  Discharge Orders     None         Isla Pence, MD 11/26/22 [phone]

## 2022-11-27 DIAGNOSIS — S0003XA Contusion of scalp, initial encounter: Secondary | ICD-10-CM | POA: Diagnosis not present

## 2022-11-27 NOTE — ED Notes (Signed)
[employer] notified re: pt being transported back to the facility, AVS summary sent with the pt, belongings sent with the pt, clothes wet, pt transported in hospital gown

## 2023-02-07 ENCOUNTER — Emergency Department (HOSPITAL_COMMUNITY): Payer: Medicare Other

## 2023-02-07 ENCOUNTER — Emergency Department (HOSPITAL_COMMUNITY)
Admission: EM | Admit: 2023-02-07 | Discharge: 2023-02-08 | Disposition: A | Discharge status: Skilled Nursing Facility | Payer: Medicare Other | Attending: Emergency Medicine | Admitting: Emergency Medicine

## 2023-02-07 ENCOUNTER — Other Ambulatory Visit: Payer: Self-pay

## 2023-02-07 ENCOUNTER — Encounter (HOSPITAL_COMMUNITY): Payer: Self-pay | Admitting: Emergency Medicine

## 2023-02-07 DIAGNOSIS — Y92129 Unspecified place in nursing home as the place of occurrence of the external cause: Secondary | ICD-10-CM | POA: Diagnosis not present

## 2023-02-07 DIAGNOSIS — S42031A Displaced fracture of lateral end of right clavicle, initial encounter for closed fracture: Secondary | ICD-10-CM | POA: Insufficient documentation

## 2023-02-07 DIAGNOSIS — W01198A Fall on same level from slipping, tripping and stumbling with subsequent striking against other object, initial encounter: Secondary | ICD-10-CM | POA: Diagnosis not present

## 2023-02-07 DIAGNOSIS — M7989 Other specified soft tissue disorders: Secondary | ICD-10-CM | POA: Insufficient documentation

## 2023-02-07 DIAGNOSIS — S0990XA Unspecified injury of head, initial encounter: Secondary | ICD-10-CM | POA: Diagnosis not present

## 2023-02-07 DIAGNOSIS — N3000 Acute cystitis without hematuria: Secondary | ICD-10-CM | POA: Diagnosis not present

## 2023-02-07 DIAGNOSIS — S4991XA Unspecified injury of right shoulder and upper arm, initial encounter: Secondary | ICD-10-CM | POA: Diagnosis present

## 2023-02-07 DIAGNOSIS — Z7982 Long term (current) use of aspirin: Secondary | ICD-10-CM | POA: Diagnosis not present

## 2023-02-07 DIAGNOSIS — S42001A Fracture of unspecified part of right clavicle, initial encounter for closed fracture: Secondary | ICD-10-CM

## 2023-02-07 LAB — URINALYSIS, W/ REFLEX TO CULTURE (INFECTION SUSPECTED)
Bilirubin Urine: NEGATIVE
Glucose, UA: NEGATIVE mg/dL
Hgb urine dipstick: NEGATIVE
Ketones, ur: NEGATIVE mg/dL
Nitrite: POSITIVE — AB
Protein, ur: NEGATIVE mg/dL
Specific Gravity, Urine: 1.017 (ref 1.005–1.030)
WBC, UA: >50 WBC/hpf (ref 0–5)
pH: 6 (ref 5.0–8.0)

## 2023-02-07 MED ORDER — CEPHALEXIN 500 MG PO CAPS: 500.0000 mg | ORAL_CAPSULE | Freq: Two times a day (BID) | ORAL | 0 refills | Status: DC

## 2023-02-07 MED ORDER — CEPHALEXIN 500 MG PO CAPS
500.0000 mg | ORAL_CAPSULE | Freq: Once | ORAL | Status: AC
  Administered 2023-02-08: 500 mg via ORAL
  Filled 2023-02-07: qty 1

## 2023-02-07 NOTE — ED Notes (Signed)
Attempted to call report, HIPAA compliant VM left for return call by Claris Gower, RN

## 2023-02-07 NOTE — ED Triage Notes (Signed)
Pt tripped over walker at nursing facility. Pt has right clavicle deformity. Pt has dementia.

## 2023-02-07 NOTE — ED Notes (Signed)
Pt pulled off purewick for the 3rd time- pt has incontinent brief in place at this time. Pt resting with eyes closed at this time.

## 2023-02-07 NOTE — ED Provider Notes (Signed)
[facility] EM[facility]PART[facility] AT [facility] Provider Note   CSN: [id number] Arrival date & time: 02/07/23  1619     History  Chief Complaint  Patient presents with   Leslie Mcknight is a 88 y.o. female.  HPI    88 year old female comes in with chief complaint of fall. Level 5 caveat for altered mental status.  Patient resides at nursing home.  She had a trip and a fall that was witnessed at the nursing home facility.  She has right clavicular deformity.  Patient has no complaints from her side.   Home Medications Prior to Admission medications   Medication Sig Start Date End Date Taking? Authorizing Provider  aspirin EC 81 MG tablet Take 81 mg by mouth daily. Swallow whole.   Yes [provider]  atorvastatin (LIPITOR) 20 MG tablet Take 20 mg by mouth at bedtime.   Yes [provider]  cephALEXin (KEFLEX) 500 MG capsule Take 1 capsule (500 mg total) by mouth 2 (two) times daily. 02/07/23  Yes Tayen Narang, MD  docusate sodium (COLACE) 100 MG capsule Take 1 capsule (100 mg total) by mouth 2 (two) times daily. 05/18/21  Yes Amin, Atlas Kuc Chirag, MD  ferrous sulfate 325 (65 FE) MG tablet Take 325 mg by mouth daily with breakfast.   Yes [provider]  Multiple Vitamin (MULTIVITAMIN) tablet Take 1 tablet by mouth daily.   Yes [provider]      Allergies    Patient has no known allergies.    Review of Systems   Review of Systems  Physical Exam Updated Vital Signs BP (!) 152/104   Pulse 76   Temp 98.2 F (36.8 C) (Oral)   Resp 17   Ht  (1.499 m)   Wt 50 kg   LMP  (LMP Unknown)   SpO2 98%   BMI 22.26 kg/m  Physical Exam Vitals and nursing note reviewed.  Constitutional:      Appearance: She is well-developed.  HENT:     Head: Atraumatic.  Neck:     Comments: No midline C-spine tenderness Cardiovascular:     Rate and Rhythm: Normal rate.  Pulmonary:     Effort: Pulmonary effort is normal.   Musculoskeletal:     Comments: Patient has deformity over the right clavicle, but she does not have significant tenderness with palpation.  Pelvis is stable, moving all 4 extremities.  Skin:    General: Skin is warm and dry.     Findings: Bruising present.  Neurological:     Mental Status: She is alert and oriented to person, place, and time. Mental status is at baseline.     ED Results / Procedures / Treatments   Labs (all labs ordered are listed, but only abnormal results are displayed) Labs Reviewed  URINALYSIS, W/ REFLEX TO CULTURE (INFECTION SUSPECTED) - Abnormal; Notable for the following components:      Result Value   APPearance HAZY (*)    Nitrite POSITIVE (*)    Leukocytes,Ua LARGE (*)    Bacteria, UA MANY (*)    All other components within normal limits  URINE CULTURE    EKG None  Radiology DG Clavicle Right  Result Date: 02/07/2023 CLINICAL DATA:  Fall, clavicle deformity EXAM: RIGHT CLAVICLE - 2+ VIEWS COMPARISON:  None Available. FINDINGS: Displaced fracture involving the lateral 3rd of the right clavicle. Fracture fragments are overriding. Degenerative changes of the glenohumeral joint. Visualized right lung is clear.  IMPRESSION: Displaced lateral clavicle fracture, as above. Electronically Signed   By: Charline Bills M.D.   On: 02/07/2023 21:32   CT Head Wo Contrast  Result Date: 02/07/2023 CLINICAL DATA:  Head trauma, moderate-severe EXAM: CT HEAD WITHOUT CONTRAST TECHNIQUE: Contiguous axial images were obtained from the base of the skull through the vertex without intravenous contrast. RADIATION DOSE REDUCTION: This exam was performed according to the departmental dose-optimization program which includes automated exposure control, adjustment of the mA and/or kV according to patient size and/or use of iterative reconstruction technique. COMPARISON:  11/26/2022 FINDINGS: Brain: There is periventricular white matter decreased attenuation consistent with small  vessel ischemic changes. Ventricles, sulci and cisterns are prominent consistent with age related involutional changes. No acute intracranial hemorrhage, mass effect or shift. No hydrocephalus. Vascular: No hyperdense vessel or unexpected calcification. Skull: Normal. Negative for fracture or focal lesion. Sinuses/Orbits: No acute finding. IMPRESSION: Atrophy and chronic small vessel ischemic changes. No acute intracranial process identified. Electronically Signed   By: Layla Maw M.D.   On: 02/07/2023 18:40    Procedures Procedures    Medications Ordered in ED Medications  cephALEXin (KEFLEX) capsule 500 mg (has no administration in time range)    ED Course/ Medical Decision Making/ A&P                             Medical Decision Making Amount and/or Complexity of Data Reviewed Labs: ordered. Radiology: ordered.  Risk Prescription drug management.   Pt with hx of dementia comes in with cc of witnessed mechanical fall.  Differential includes clavicle fracture, cervical spine fracture, subdural hematoma, ich.  Xray of clavicle ordered and reviewed - it is positive for clavicular fracture.  CT scan of the head independently interpreted.  There is no evidence of brain bleed.  Tried to contact the power of attorney, but unsuccessful.  Stable for discharge at this time.  Urine analysis does show infection.  It is unclear to [place] if she is symptomatic or not, but we will put her on antibiotics.  Final Clinical Impression(s) / ED Diagnoses Final diagnoses:  Closed displaced fracture of right clavicle, unspecified part of clavicle, initial encounter  Acute cystitis without hematuria    Rx / DC Orders ED Discharge Orders          Ordered    cephALEXin (KEFLEX) 500 MG capsule  2 times daily        02/07/23 2314              Derwood Kaplan, MD 02/07/23 2324

## 2023-02-07 NOTE — Discharge Instructions (Addendum)
Leslie Mcknight has clavicular fracture due to the fall.  She is in a sling, the arm needs to remain in the sling.  Please call the orthopedist for a follow-up appointment in 2 weeks.  She also has bladder infection.  She has been started on antibiotics. Give Tylenol for pain control.

## 2023-02-08 DIAGNOSIS — S42031A Displaced fracture of lateral end of right clavicle, initial encounter for closed fracture: Secondary | ICD-10-CM | POA: Diagnosis not present

## 2023-02-08 LAB — URINE CULTURE

## 2023-02-09 LAB — URINE CULTURE: Culture: 90000 — AB

## 2023-02-10 ENCOUNTER — Telehealth (HOSPITAL_BASED_OUTPATIENT_CLINIC_OR_DEPARTMENT_OTHER): Payer: Self-pay | Admitting: *Deleted

## 2023-02-10 NOTE — Telephone Encounter (Signed)
Post ED Visit - Positive Culture Follow-up  Culture report reviewed by antimicrobial stewardship pharmacist: [facility]  Enzo Bi, Pharm.D.  Celedonio Miyamoto, Pharm.D., BCPS AQ-ID  Garvin Fila, Pharm.D., BCPS  Georgina Pillion, [address].D., BCPS  [place], [address].D., BCPS, AAHIVP  Estella Husk, Pharm.D., BCPS, AAHIVP  Lysle Pearl, PharmD, BCPS  Phillips Climes, PharmD, BCPS  Agapito Games, PharmD, BCPS  Verlan Friends, PharmD  Mervyn Gay, PharmD, BCPS  Daylene Posey, PharmD  [facility]  Len Childs, PharmD  Greer Pickerel, PharmD  Adalberto Cole, PharmD  Perlie Gold, Rph  Lonell Face) Jean Rosenthal, PharmD  Earl Many, PharmD  Junita Push, PharmD  Dorna Leitz, PharmD  Terrilee Files, PharmD  Lynann Beaver, PharmD  Keturah Barre, PharmD  Loralee Pacas, PharmD  Bernadene Person, PharmD   Positive urine culture Treated with Cephalexin, organism sensitive to the same and no further patient follow-up is required at this time.  [name] 02/10/2023, 11:49 AM

## 2023-02-26 ENCOUNTER — Ambulatory Visit (INDEPENDENT_AMBULATORY_CARE_PROVIDER_SITE_OTHER): Payer: Medicare Other | Admitting: Orthopedic Surgery

## 2023-02-26 ENCOUNTER — Encounter: Payer: Self-pay | Admitting: Orthopedic Surgery

## 2023-02-26 ENCOUNTER — Other Ambulatory Visit (INDEPENDENT_AMBULATORY_CARE_PROVIDER_SITE_OTHER): Payer: Medicare Other

## 2023-02-26 DIAGNOSIS — S42001A Fracture of unspecified part of right clavicle, initial encounter for closed fracture: Secondary | ICD-10-CM

## 2023-02-26 NOTE — Progress Notes (Signed)
New Patient Visit  Assessment: Leslie Mcknight is a 88 y.o. female with the following: 1. Closed displaced fracture of right clavicle, unspecified part of clavicle, initial encounter  Plan: Leslie Mcknight fell recently, and sustained a right clavicle fracture.  She has advanced dementia.  It is possible that this is a chronic injury.  There is no gross motion.  No bruising or swelling over the fracture site.  She has no pain with passive range of motion.  No need for sling.  Medicines as needed.  She does not need to follow-up.  Follow-up: Return if symptoms worsen or fail to improve.  Subjective:  Chief Complaint  Patient presents with   Fracture    R clavicle DOI 02/07/23    History of Present Illness: Leslie Mcknight is a 88 y.o. female who presents for evaluation of right shoulder pain.  She fell a few weeks ago.  She has advanced dementia.  She underwent a full exam.  There was concern for a clavicle injury.  She used the sling, but not for very long.  She would just take it off.  She is not take any medicines.  She is using her right arm.  She appears comfortable.  No numbness or tingling.   Review of Systems: Unable to assess.   Medical History:  Past Medical History:  Diagnosis Date   Anemia    Cognitive communication deficit    Dementia (HCC)    HLD (hyperlipidemia)    MDD (major depressive disorder), single episode    Sleep apnea    does not use CPAP   UTI (urinary tract infection)     Past Surgical History:  Procedure Laterality Date   DEBRIDEMENT AND CLOSURE WOUND Left 02/20/2022   Procedure: DEBRIDEMENT AND CLOSURE SCALP WOUND;  Surgeon: Janne Napoleon, MD;  Location: [facility] OR;  Service: Plastics;  Laterality: Left;  2 hours   INTRAMEDULLARY (IM) NAIL INTERTROCHANTERIC Left 05/15/2021   Procedure: INTRAMEDULLARY (IM) NAIL INTERTROCHANTRIC;  Surgeon: Samson Frederic, MD;  Location: [facility] OR;  Service: Orthopedics;  Laterality: Left;   SKIN SPLIT GRAFT Left 02/20/2022    Procedure: SKIN GRAFT SPLIT THICKNESS;  Surgeon: Janne Napoleon, MD;  Location: [facility] OR;  Service: Plastics;  Laterality: Left;    No family history on file. Social History   Tobacco Use   Smoking status: Never   Smokeless tobacco: Never  Vaping Use   Vaping Use: Never used  Substance Use Topics   Alcohol use: Never   Drug use: Never    No Known Allergies  No outpatient medications have been marked as taking for the 02/26/23 encounter (Office Visit) with Oliver Barre, MD.    Objective: LMP  (LMP Unknown)   Physical Exam:  General: Elderly female. and No acute distress. Gait: Slow, steady gait.  Right shoulder with mild deformity and prominence over the distal clavicle.  There is no gross motion.  No bruising.  No swelling.  She tolerates gentle passive range of motion.  Fingers are warm and well-perfused.  She does not answer questions appropriately.  IMAGING: I personally ordered and reviewed the following images  X-rays right clavicle were obtained in clinic today.  There is a fracture of the distal clavicle.  No obvious callus formation.  Unknown chronicity.  No issues with the glenohumeral joint.  No dislocation.  Minimal degenerative changes.  No bony lesions.  Impression: Stable right distal clavicle fracture, unknown chronicity   New Medications:  No orders of the  defined types were placed in this encounter.     Oliver Barre, MD  02/26/2023 11:37 AM

## 2023-03-10 ENCOUNTER — Emergency Department (HOSPITAL_COMMUNITY)
Admission: EM | Admit: 2023-03-10 | Discharge: 2023-03-10 | Disposition: A | Discharge status: Home / Self Care | Payer: Medicare Other | Attending: Emergency Medicine | Admitting: Emergency Medicine

## 2023-03-10 ENCOUNTER — Emergency Department (HOSPITAL_COMMUNITY): Payer: Medicare Other

## 2023-03-10 ENCOUNTER — Encounter (HOSPITAL_COMMUNITY): Payer: Self-pay

## 2023-03-10 ENCOUNTER — Other Ambulatory Visit: Payer: Self-pay

## 2023-03-10 DIAGNOSIS — Y92002 Bathroom of unspecified non-institutional (private) residence single-family (private) house as the place of occurrence of the external cause: Secondary | ICD-10-CM | POA: Diagnosis not present

## 2023-03-10 DIAGNOSIS — W182XXA Fall in (into) shower or empty bathtub, initial encounter: Secondary | ICD-10-CM | POA: Insufficient documentation

## 2023-03-10 DIAGNOSIS — Z79899 Other long term (current) drug therapy: Secondary | ICD-10-CM | POA: Insufficient documentation

## 2023-03-10 DIAGNOSIS — S6992XA Unspecified injury of left wrist, hand and finger(s), initial encounter: Secondary | ICD-10-CM | POA: Diagnosis present

## 2023-03-10 DIAGNOSIS — F0390 Unspecified dementia without behavioral disturbance: Secondary | ICD-10-CM | POA: Diagnosis not present

## 2023-03-10 DIAGNOSIS — R410 Disorientation, unspecified: Secondary | ICD-10-CM | POA: Insufficient documentation

## 2023-03-10 DIAGNOSIS — W19XXXA Unspecified fall, initial encounter: Secondary | ICD-10-CM

## 2023-03-10 DIAGNOSIS — Z7982 Long term (current) use of aspirin: Secondary | ICD-10-CM | POA: Diagnosis not present

## 2023-03-10 DIAGNOSIS — S62617A Displaced fracture of proximal phalanx of left little finger, initial encounter for closed fracture: Secondary | ICD-10-CM | POA: Diagnosis not present

## 2023-03-10 DIAGNOSIS — S7001XA Contusion of right hip, initial encounter: Secondary | ICD-10-CM | POA: Insufficient documentation

## 2023-03-10 NOTE — Discharge Instructions (Signed)
You are seen in the emergency department for evaluation of injuries from a fall.  You had a CAT scan of your head and neck that did not show any acute findings.  You also had an x-ray of your chest pelvis right hip and left hand.  There was a fracture of your little finger proximal phalanx on your left hand.  This was placed in a splint.  This will need follow-up with orthopedics hand.  Return to the emergency department if any worsening or concerning symptoms.

## 2023-03-10 NOTE — ED Notes (Signed)
Called [facility] at this time for pt to be taken back home. Misty Stanley

## 2023-03-10 NOTE — ED Triage Notes (Signed)
[facility] reports pt coming from [facility] for a unwitnessed fall in the bathroom. Staff found pt in the bathroom sitting up. PT c/o neck and head pain as well as right flank pain.

## 2023-03-10 NOTE — ED Provider Notes (Signed)
[place] [facility]ERGENCY DE[facility]MENT AT [facility] Provider Note   CSN: [id number] Arrival date & time: 03/10/23  8657     History  Chief Complaint  Patient presents with   Leslie Mcknight Leslie Mcknight is a 88 y.o. female.  Level 5 caveat secondary to dementia.  She was brought in by EMS from her facility after unwitnessed fall.  She was found in the bathroom sitting on the floor after presumed fall.  Patient does not recall the fall.  Reportedly had head and neck pain right flank pain.  Patient currently denies any pain and has no complaints.  She is not on blood thinners.  The history is provided by the patient and the EMS personnel.  Fall This is a recurrent problem. The problem has not changed since onset.Pertinent negatives include no chest pain, no abdominal pain, no headaches and no shortness of breath. Nothing aggravates the symptoms. Nothing relieves the symptoms. She has tried nothing for the symptoms. The treatment provided no relief.       Home Medications Prior to Admission medications   Medication Sig Start Date End Date Taking? Authorizing Provider  aspirin EC 81 MG tablet Take 81 mg by mouth daily. Swallow whole.    [provider]  atorvastatin (LIPITOR) 20 MG tablet Take 20 mg by mouth at bedtime.    [provider]  cephALEXin (KEFLEX) 500 MG capsule Take 1 capsule (500 mg total) by mouth 2 (two) times daily. 02/07/23   Derwood Kaplan, MD  docusate sodium (COLACE) 100 MG capsule Take 1 capsule (100 mg total) by mouth 2 (two) times daily. 05/18/21   Amin, Loura Halt, MD  ferrous sulfate 325 (65 FE) MG tablet Take 325 mg by mouth daily with breakfast.    [provider]  Multiple Vitamin (MULTIVITAMIN) tablet Take 1 tablet by mouth daily.    [provider]      Allergies    Patient has no known allergies.    Review of Systems   Review of Systems  Unable to perform ROS: Dementia  Respiratory:  Negative for shortness of  breath.   Cardiovascular:  Negative for chest pain.  Gastrointestinal:  Negative for abdominal pain.  Neurological:  Negative for headaches.    Physical Exam Updated Vital Signs BP (!) 194/89 (BP Location: Right Arm)   Pulse (!) 51   Temp 98 F (36.7 C) (Oral)   Resp 18   LMP  (LMP Unknown)   SpO2 98%  Physical Exam Vitals and nursing note reviewed.  Constitutional:      General: She is not in acute distress.    Appearance: Normal appearance. She is well-developed.  HENT:     Head: Normocephalic and atraumatic.  Eyes:     Conjunctiva/sclera: Conjunctivae normal.  Neck:     Comments: Patient in cervical collar trach midline.  No midline cervical spine tenderness. Cardiovascular:     Rate and Rhythm: Normal rate and regular rhythm.     Heart sounds: No murmur heard. Pulmonary:     Effort: Pulmonary effort is normal. No respiratory distress.     Breath sounds: Normal breath sounds.  Abdominal:     Palpations: Abdomen is soft.     Tenderness: There is no abdominal tenderness. There is no guarding or rebound.  Musculoskeletal:        General: Tenderness and deformity present. Normal range of motion.     Cervical back: No tenderness.     Comments:  She has a swollen tender left fifth finger.  There is also some bruising on the lateral side of her right hip.  Extremities are full range of motion without any limitations.  Distal pulses motor and sensation intact.  Skin:    General: Skin is warm and dry.     Capillary Refill: Capillary refill takes less than 2 seconds.  Neurological:     General: No focal deficit present.     Mental Status: She is alert. Mental status is at baseline. She is disoriented.     ED Results / Procedures / Treatments   Labs (all labs ordered are listed, but only abnormal results are displayed) Labs Reviewed - No data to display  EKG EKG Interpretation  Date/Time:  Sunday Mar 10 2023 08:46:57 EDT Ventricular Rate:  52 PR Interval:  140 QRS  Duration: 96 QT Interval:  433 QTC Calculation: 403 R Axis:   -10 Text Interpretation: Sinus rhythm no acute ST,Ts No significant change since prior 8/23 Confirmed by Meridee Score [phone]) on 03/10/2023 8:50:22 AM  Radiology CT Cervical Spine Wo Contrast  Addendum Date: 03/10/2023   ADDENDUM REPORT: 03/10/2023 09:49 ADDENDUM: Additionally, partially visible mild T4 superior endplate compression fracture also noted and chronic, was present on prior cervical spine CT 11/26/2022. Electronically Signed   By: Odessa Fleming M.D.   On: 03/10/2023 09:49   Result Date: 03/10/2023 CLINICAL DATA:  88 year old female status post unwitnessed fall, found down in bathroom. EXAM: CT CERVICAL SPINE WITHOUT CONTRAST TECHNIQUE: Multidetector CT imaging of the cervical spine was performed without intravenous contrast. Multiplanar CT image reconstructions were also generated. RADIATION DOSE REDUCTION: This exam was performed according to the departmental dose-optimization program which includes automated exposure control, adjustment of the mA and/or kV according to patient size and/or use of iterative reconstruction technique. COMPARISON:  Cervical spine CT 02/07/2023. FINDINGS: Alignment: Stable, exaggerated cervical lordosis and upper thoracic kyphosis Cervicothoracic junction alignment is within normal limits. Bilateral posterior element alignment is within normal limits. Skull base and vertebrae: Osteopenia. Visualized skull base is intact. No atlanto-occipital dissociation. C1 and C2 appear intact and aligned. No acute osseous abnormality identified. Chronic C7 moderate anterior wedge compression fracture is stable from last month. No significant retropulsion. Soft tissues and spinal canal: No prevertebral fluid or swelling. No visible canal hematoma. Negative noncontrast visible neck soft tissues. Disc levels: Mild for age cervical spine degeneration. No CT evidence of cervical spinal stenosis. Upper chest: Osteopenia.  Chronic T3 compression fracture is moderate to severe, stable from last month with minimal retropulsion. Chronic distal right clavicle fracture. Upper lungs are clear. Mild Calcified aortic atherosclerosis. IMPRESSION: 1. No acute traumatic injury identified in the cervical spine. 2. Osteopenia with stable chronic C7 and T3 compression fractures. Electronically Signed: By: Odessa Fleming M.D. On: 03/10/2023 09:46   DG Chest 1 View  Result Date: 03/10/2023 CLINICAL DATA:  88 year old female status post unwitnessed fall, found down in bathroom. EXAM: CHEST  1 VIEW COMPARISON:  Chest radiographs 11/26/2022 and earlier. FINDINGS: AP supine view of the chest at 0922 hours. Lower lung volumes. Stable mediastinal contours with tortuous thoracic aorta, mild-to-moderate cardiomegaly. Visualized tracheal air column is within normal limits. Allowing for portable technique the lungs are clear. No pneumothorax or pleural effusion identified on this supine view. Paucity of bowel gas in the abdomen. No acute osseous abnormality identified. IMPRESSION: Lower lung volumes. No acute cardiopulmonary abnormality or acute traumatic injury identified. Electronically Signed   By: Althea Grimmer.D.  On: 03/10/2023 09:43   DG Hand Complete Left  Result Date: 03/10/2023 CLINICAL DATA:  Fall.  Left hand pain. EXAM: LEFT HAND - COMPLETE 3 VIEW COMPARISON:  None Available. FINDINGS: Oblique fracture in the proximal phalanx of fifth digit demonstrates mild ulnar and dorsal angulation. Fracture is displaced 2-3 mm. A remote ulnar styloid fracture is present. No additional acute fractures are present. Osteopenia is noted. Degenerative changes are present throughout the hand. IMPRESSION: 1. Oblique fracture in the proximal phalanx of the fifth digit with mild ulnar and dorsal angulation. 2. Remote ulnar styloid fracture. Electronically Signed   By: Marin Roberts M.D.   On: 03/10/2023 09:42   CT Head Wo Contrast  Result Date:  03/10/2023 CLINICAL DATA:  88 year old female status post unwitnessed fall, found down in bathroom. EXAM: CT HEAD WITHOUT CONTRAST TECHNIQUE: Contiguous axial images were obtained from the base of the skull through the vertex without intravenous contrast. RADIATION DOSE REDUCTION: This exam was performed according to the departmental dose-optimization program which includes automated exposure control, adjustment of the mA and/or kV according to patient size and/or use of iterative reconstruction technique. COMPARISON:  Head CT 02/07/2023. FINDINGS: Brain: Stable cerebral volume. Ventricular enlargement, probably ex vacuo and stable. No midline shift, mass effect, or evidence of intracranial mass lesion. No acute intracranial hemorrhage identified. No cortically based acute infarct identified. Stable gray-white matter differentiation throughout the brain. Mild to moderate for age patchy white matter hypodensity greater in the left hemisphere. Vascular: Calcified atherosclerosis at the skull base. No suspicious intracranial vascular hyperdensity. Skull: Stable. Osteopenia. No acute osseous abnormality identified. Sinuses/Orbits: Visualized paranasal sinuses and mastoids are stable and well aerated. Other: No acute orbit or scalp soft tissue injury identified. IMPRESSION: Stable from last month. No acute intracranial abnormality or acute traumatic injury identified. Electronically Signed   By: Odessa Fleming M.D.   On: 03/10/2023 09:42   DG Hip Unilat With Pelvis 2-3 Views Right  Result Date: 03/10/2023 CLINICAL DATA:  Fall with right hip pain. EXAM: DG HIP (WITH OR WITHOUT PELVIS) 2-3V RIGHT COMPARISON:  11/26/2022 pelvic radiographs FINDINGS: AP view of the pelvis and AP/frog leg views of the right hip. Left proximal femur fixation without acute hardware complication. Right femoral head is located. Osteopenia. Sacroiliac joints are symmetric. Surgical clips project over the right hemipelvis. IMPRESSION: No acute osseous  abnormality. Electronically Signed   By: Jeronimo Greaves M.D.   On: 03/10/2023 09:37    Procedures Procedures    Medications Ordered in ED Medications - No data to display  ED Course/ Medical Decision Making/ A&P Clinical Course as of 03/10/23 1706  Sun Mar 10, 2023  1041 Discussed with patient's contact niece Evans Lance and updated on results of workup.  She is comfortable with her going back to the facility.  Patient was placed in an ulnar gutter.  Will give her facility contact information to follow-up with hand surgery on-call. [MB]    Clinical Course User Index [MB] Terrilee Files, MD                             Medical Decision Making Amount and/or Complexity of Data Reviewed Radiology: ordered.   This patient complains of fall, left fifth finger pain; this involves an extensive number of treatment Options and is a complaint that carries with it a high risk of complications and morbidity. The differential includes fracture, contusion, dislocation, intrathoracic injury, intra-abdominal injury I ordered imaging  studies which included CT head and cervical spine, x-rays of chest pelvis right hip and left hand and I independently    visualized and interpreted imaging which showed left fifth proximal phalanx fracture Additional history obtained from EMS Previous records obtained and reviewed in epic including prior ED visits  Cardiac monitoring reviewed, sinus rhythm Social determinants considered, no significant barriers although dementia makes it hard for advocate for herself Critical Interventions: None  After the interventions stated above, I reevaluated the patient and found patient to be awake and in no distress Admission and further testing considered, no indications for admission.  Patient placed in ulnar gutter.  Follow-up instructions and return instructions given to facility.         Final Clinical Impression(s) / ED Diagnoses Final diagnoses:  Fall, initial  encounter  Closed displaced fracture of proximal phalanx of left little finger, initial encounter  Contusion of right hip, initial encounter    Rx / DC Orders ED Discharge Orders     None         Terrilee Files, MD 03/10/23 1708

## 2023-10-24 ENCOUNTER — Emergency Department (HOSPITAL_COMMUNITY): Payer: Medicare Other

## 2023-10-24 ENCOUNTER — Other Ambulatory Visit: Payer: Self-pay

## 2023-10-24 ENCOUNTER — Encounter (HOSPITAL_COMMUNITY): Payer: Self-pay

## 2023-10-24 ENCOUNTER — Emergency Department (HOSPITAL_COMMUNITY)
Admission: EM | Admit: 2023-10-24 | Discharge: 2023-10-24 | Disposition: A | Discharge status: Home / Self Care | Payer: Medicare Other | Attending: Emergency Medicine | Admitting: Emergency Medicine

## 2023-10-24 DIAGNOSIS — W0110XA Fall on same level from slipping, tripping and stumbling with subsequent striking against unspecified object, initial encounter: Secondary | ICD-10-CM | POA: Insufficient documentation

## 2023-10-24 DIAGNOSIS — F0390 Unspecified dementia without behavioral disturbance: Secondary | ICD-10-CM | POA: Insufficient documentation

## 2023-10-24 DIAGNOSIS — S0990XA Unspecified injury of head, initial encounter: Secondary | ICD-10-CM | POA: Diagnosis not present

## 2023-10-24 DIAGNOSIS — W19XXXA Unspecified fall, initial encounter: Secondary | ICD-10-CM

## 2023-10-24 DIAGNOSIS — S0511XA Contusion of eyeball and orbital tissues, right eye, initial encounter: Secondary | ICD-10-CM | POA: Diagnosis not present

## 2023-10-24 DIAGNOSIS — Z7982 Long term (current) use of aspirin: Secondary | ICD-10-CM | POA: Insufficient documentation

## 2023-10-24 DIAGNOSIS — R519 Headache, unspecified: Secondary | ICD-10-CM | POA: Diagnosis present

## 2023-10-24 NOTE — ED Provider Notes (Addendum)
 [place] EMERGENCY DEPARTMENT AT [facility] Provider Note   CSN: [id number] Arrival date & time: 10/24/23  1405     History  Chief Complaint  Patient presents with   [name] is a 89 y.o. female.  Patient brought in by EMS from [facility] at [facility]  Patient had a fall today hit the back of her head.  Patient has she complaining of some forehead pain no loss of consciousness patient not on blood thinners.  No bleeding no evidence of any injury.  Past medical history is significant for dementia urinary tract infection hyperlipidemia.       Home Medications Prior to Admission medications   Medication Sig Start Date End Date Taking? Authorizing Provider  aspirin EC 81 MG tablet Take 81 mg by mouth daily. Swallow whole.    [provider]  atorvastatin (LIPITOR) 20 MG tablet Take 20 mg by mouth at bedtime.    [provider]  cephALEXin  (KEFLEX ) 500 MG capsule Take 1 capsule (500 mg total) by mouth 2 (two) times daily. 02/07/23   Charlyn Sora, MD  docusate sodium  (COLACE) 100 MG capsule Take 1 capsule (100 mg total) by mouth 2 (two) times daily. 05/18/21   Amin, Ankit C, MD  ferrous sulfate  325 (65 FE) MG tablet Take 325 mg by mouth daily with breakfast.    [provider]  Multiple Vitamin (MULTIVITAMIN) tablet Take 1 tablet by mouth daily.    [provider]      Allergies    Patient has no known allergies.    Review of Systems   Review of Systems  Unable to perform ROS: Dementia    Physical Exam Updated Vital Signs BP (!) 112/99 (BP Location: Left Arm)   Pulse 70   Temp 98.4 F (36.9 C) (Oral)   Resp 16   Ht 1.499 m (4' 11)   Wt 50 kg   LMP  (LMP Unknown)   SpO2 99%   BMI 22.26 kg/m  Physical Exam Vitals and nursing note reviewed.  Constitutional:      General: She is not in acute distress.    Appearance: Normal appearance. She is well-developed.  HENT:     Head:  Normocephalic and atraumatic.     Comments: No evidence any head trauma contusions or abrasions.    Mouth/Throat:     Mouth: Mucous membranes are moist.  Eyes:     Extraocular Movements: Extraocular movements intact.     Conjunctiva/sclera: Conjunctivae normal.     Comments: Right eye with scleral hematoma.  No evidence of any hyphema or anterior chamber blood.  Neck:     Comments: Patient moving neck spontaneously.  No posterior tenderness to palpation. Cardiovascular:     Rate and Rhythm: Normal rate and regular rhythm.     Heart sounds: No murmur heard. Pulmonary:     Effort: Pulmonary effort is normal. No respiratory distress.     Breath sounds: Normal breath sounds.  Abdominal:     Palpations: Abdomen is soft.     Tenderness: There is no abdominal tenderness.  Musculoskeletal:        General: No swelling, tenderness, deformity or signs of injury.     Cervical back: Neck supple.     Comments: No evidence of any extremity injury.  No deformity.  Good movement of all extremities including movement at both hips without any pain.  Skin:    General: Skin is warm  and dry.     Capillary Refill: Capillary refill takes less than 2 seconds.  Neurological:     Mental Status: She is alert. Mental status is at baseline.  Psychiatric:        Mood and Affect: Mood normal.     ED Results / Procedures / Treatments   Labs (all labs ordered are listed, but only abnormal results are displayed) Labs Reviewed - No data to display  EKG None  Radiology No results found.  Procedures Procedures    Medications Ordered in ED Medications - No data to display  ED Course/ Medical Decision Making/ A&P                                 Medical Decision Making Amount and/or Complexity of Data Reviewed Radiology: ordered.   Patient from nursing facility has a history of dementia not on blood thinners.  Reported that she hit the back of her head.  Will get CT head.  Appears to be no  evidence of any other injuries.   Head CT is negative no evidence of any acute injuries.  Patient stable for discharge back to facility.  Final Clinical Impression(s) / ED Diagnoses Final diagnoses:  Fall, initial encounter  Injury of head, initial encounter    Rx / DC Orders ED Discharge Orders     None         Geraldene Hamilton, MD 10/24/23 1549    Geraldene Hamilton, MD 10/24/23 2052

## 2023-10-24 NOTE — ED Notes (Signed)
 Report attempted for 3rd time to [facility], no answer at facility. Pt currently calm and cooperative

## 2023-10-24 NOTE — ED Notes (Signed)
 Report attempted x 2 to [facility], will try again shortly

## 2023-10-24 NOTE — ED Triage Notes (Signed)
 Pt BIB ems from [facility] [place] from a fall today. Pt fell hit back of her head, no LOC, not on blood thinner. Pt denies any pain at this time. No laceration, no knots noted to back head at this time.

## 2023-10-24 NOTE — Discharge Instructions (Signed)
 CT without evidence of any acute injury.  Patient stable for discharge back to facility.

## 2023-11-01 ENCOUNTER — Emergency Department (HOSPITAL_COMMUNITY)
Admission: EM | Admit: 2023-11-01 | Discharge: 2023-11-02 | Disposition: A | Discharge status: Home / Self Care | Payer: Medicare Other | Attending: Emergency Medicine | Admitting: Emergency Medicine

## 2023-11-01 ENCOUNTER — Encounter (HOSPITAL_COMMUNITY): Payer: Self-pay | Admitting: *Deleted

## 2023-11-01 ENCOUNTER — Emergency Department (HOSPITAL_COMMUNITY): Payer: Medicare Other

## 2023-11-01 ENCOUNTER — Other Ambulatory Visit: Payer: Self-pay

## 2023-11-01 DIAGNOSIS — M4800 Spinal stenosis, site unspecified: Secondary | ICD-10-CM | POA: Diagnosis not present

## 2023-11-01 DIAGNOSIS — R296 Repeated falls: Secondary | ICD-10-CM | POA: Diagnosis not present

## 2023-11-01 DIAGNOSIS — R109 Unspecified abdominal pain: Secondary | ICD-10-CM | POA: Insufficient documentation

## 2023-11-01 DIAGNOSIS — M545 Low back pain, unspecified: Secondary | ICD-10-CM | POA: Diagnosis present

## 2023-11-01 DIAGNOSIS — Z7982 Long term (current) use of aspirin: Secondary | ICD-10-CM | POA: Insufficient documentation

## 2023-11-01 DIAGNOSIS — M25559 Pain in unspecified hip: Secondary | ICD-10-CM

## 2023-11-01 MED ORDER — OXYCODONE-ACETAMINOPHEN 5-325 MG PO TABS: 1.0000 | ORAL_TABLET | Freq: Three times a day (TID) | ORAL | 0 refills | Status: AC | PRN

## 2023-11-01 MED ORDER — HYDROMORPHONE HCL 1 MG/ML IJ SOLN
0.5000 mg | Freq: Once | INTRAMUSCULAR | Status: AC
  Administered 2023-11-01: 0.5 mg via INTRAMUSCULAR
  Filled 2023-11-01: qty 0.5

## 2023-11-01 NOTE — ED Notes (Signed)
 Spoke with family to assess pt baseline function. Cognition is at baseline as pt lives in a locked unit at [facility]. Pt is not at baseline for physical function. Pt typically walks independently, but has had several falls recently.  In an attempt to ambulate pt, pt is unable to bear full weight on right leg.

## 2023-11-01 NOTE — ED Provider Notes (Signed)
 [place] EMERGENCY DEPARTMENT AT [facility] Provider Note   CSN: [id number] Arrival date & time: 11/01/23  1150     History  Chief Complaint  Patient presents with   Flank Pain    Leslie Mcknight is a 89 y.o. female.  Patient has a history of dementia and has been having numerous falls.  She complains of lower back pain.  The history is provided by the patient and the EMS personnel. No language interpreter was used.  Flank Pain This is a new problem. The current episode started more than 2 days ago. The problem occurs constantly. The problem has not changed since onset.Pertinent negatives include no chest pain, no abdominal pain and no headaches. Exacerbated by: Movement. Nothing relieves the symptoms.       Home Medications Prior to Admission medications   Medication Sig Start Date End Date Taking? Authorizing Provider  citalopram (CELEXA) 40 MG tablet Take 40 mg by mouth daily.   Yes [provider]  loperamide (IMODIUM) 2 MG capsule Take 4 mg by mouth daily as needed for diarrhea or loose stools.   Yes [provider]  aspirin EC 81 MG tablet Take 81 mg by mouth daily. Swallow whole.   Yes [provider]  atorvastatin (LIPITOR) 20 MG tablet Take 20 mg by mouth at bedtime.   Yes [provider]  docusate sodium  (COLACE) 100 MG capsule Take 1 capsule (100 mg total) by mouth 2 (two) times daily. 05/18/21  Yes Amin, Ankit C, MD  ferrous sulfate  325 (65 FE) MG tablet Take 325 mg by mouth daily with breakfast.   Yes [provider]  Multiple Vitamin (MULTIVITAMIN) tablet Take 1 tablet by mouth daily.   Yes [provider]      Allergies    Patient has no known allergies.    Review of Systems   Review of Systems  Constitutional:  Negative for appetite change and fatigue.  HENT:  Negative for congestion, ear discharge and sinus pressure.   Eyes:  Negative for discharge.  Respiratory:  Negative for cough.    Cardiovascular:  Negative for chest pain.  Gastrointestinal:  Negative for abdominal pain and diarrhea.  Genitourinary:  Positive for flank pain. Negative for frequency and hematuria.  Musculoskeletal:  Negative for back pain.  Skin:  Negative for rash.  Neurological:  Negative for seizures and headaches.  Psychiatric/Behavioral:  Negative for hallucinations.     Physical Exam Updated Vital Signs BP (!) 135/98 (BP Location: Left Arm)   Pulse 65   Temp 98.7 F (37.1 C) (Oral)   Resp 18   Ht 4' 11 (1.499 m)   Wt 50 kg   LMP  (LMP Unknown)   SpO2 97%   BMI 22.26 kg/m  Physical Exam Vitals and nursing note reviewed.  Constitutional:      Appearance: She is well-developed.  HENT:     Head: Normocephalic.     Nose: Nose normal.  Eyes:     General: No scleral icterus.    Conjunctiva/sclera: Conjunctivae normal.  Neck:     Thyroid: No thyromegaly.  Cardiovascular:     Rate and Rhythm: Normal rate and regular rhythm.     Heart sounds: No murmur heard.    No friction rub. No gallop.  Pulmonary:     Breath sounds: No stridor. No wheezing or rales.  Chest:     Chest wall: No tenderness.  Abdominal:     General: There is no distension.  Tenderness: There is no abdominal tenderness. There is no rebound.  Musculoskeletal:     Cervical back: Neck supple.     Comments: Tender right flank.  Patient unable to ambulate secondary to pain in her right lower back.  Lymphadenopathy:     Cervical: No cervical adenopathy.  Skin:    Findings: No erythema or rash.  Neurological:     Motor: No abnormal muscle tone.     Coordination: Coordination normal.     Comments: Oriented to person only  Psychiatric:        Behavior: Behavior normal.     ED Results / Procedures / Treatments   Labs (all labs ordered are listed, but only abnormal results are displayed) Labs Reviewed - No data to display  EKG None  Radiology CT Hip Right Wo Contrast Result Date: 11/01/2023 CLINICAL  DATA:  Hip trauma, fracture suspected, no prior imaging EXAM: CT OF THE RIGHT HIP WITHOUT CONTRAST TECHNIQUE: Multidetector CT imaging of the right hip was performed according to the standard protocol. Multiplanar CT image reconstructions were also generated. RADIATION DOSE REDUCTION: This exam was performed according to the departmental dose-optimization program which includes automated exposure control, adjustment of the mA and/or kV according to patient size and/or use of iterative reconstruction technique. COMPARISON:  None Available. FINDINGS: Bones/Joint/Cartilage There is diffuse osteopenia of the visualized osseous structures. No acute fracture or dislocation. No aggressive osseous lesion. Mild degenerative changes of right hip joint. Ligaments Suboptimally assessed by CT. Muscles and Tendons Suboptimally assessed by CT scan. No discrete intramuscular hematoma seen. Soft tissues There is mild subcutaneous fat stranding over the right lower lateral hip region, which is nonspecific but can be seen with soft tissue contusion/injury. Correlate clinically and with physical examination. Please refer to separately dictated CT scan abdomen and pelvis report for details regarding the soft tissues. IMPRESSION: *No acute osseous abnormality of the right hip. Electronically Signed   By: Ree Molt M.D.   On: 11/01/2023 14:59   CT ABDOMEN PELVIS WO CONTRAST Result Date: 11/01/2023 CLINICAL DATA:  Abdominal pain, acute, nonlocalized. Right flank pain. History of multiple falls. EXAM: CT ABDOMEN AND PELVIS WITHOUT CONTRAST TECHNIQUE: Multidetector CT imaging of the abdomen and pelvis was performed following the standard protocol without IV contrast. RADIATION DOSE REDUCTION: This exam was performed according to the departmental dose-optimization program which includes automated exposure control, adjustment of the mA and/or kV according to patient size and/or use of iterative reconstruction technique. COMPARISON:   None Available. FINDINGS: Examination is moderately degraded by patient's motion during data acquisition. Evaluation for subtle rib fracture is therefore markedly limited on this exam. Lower chest: There is small right pleural effusion. The lung bases are otherwise clear. No left pleural effusion. The heart is mildly enlarged in size. No pericardial effusion. Hepatobiliary: The liver is normal in size. Non-cirrhotic configuration. No suspicious mass. There is a 1.1 x 1.8 cm cyst in the right hepatic dome and an additional 1.4 x 1.6 cm cyst in the right hepatic lobe, segment 5. There are additional at least 3, subcentimeter, hypoattenuating foci, which are too small to adequately characterize. No intrahepatic or extrahepatic bile duct dilation. No calcified gallstones. Normal gallbladder wall thickness. No pericholecystic inflammatory changes. Pancreas: Unremarkable. No pancreatic ductal dilatation or surrounding inflammatory changes. Spleen: Within normal limits. No focal lesion. Adrenals/Urinary Tract: Adrenal glands are unremarkable. No suspicious renal mass within limitations of this unenhanced exam. There are at least 3, subcentimeter sized nonobstructing calculi in the left kidney lower  pole. No other nephroureterolithiasis on either side. No hydroureteronephrosis. Multiple bilateral sinus cysts noted. Unremarkable urinary bladder. Stomach/Bowel: There is at least moderate sized sliding hiatal hernia. No disproportionate dilation of the small or large bowel loops. No evidence of abnormal bowel wall thickening or inflammatory changes. The appendix was not visualized; however there is no acute inflammatory process in the right lower quadrant. There are multiple diverticula mainly in the left hemi colon, without imaging signs of diverticulitis. Vascular/Lymphatic: No ascites or pneumoperitoneum. No abdominal or pelvic lymphadenopathy, by size criteria. No aneurysmal dilation of the major abdominal arteries. There  are moderate peripheral atherosclerotic vascular calcifications of the aorta and its major branches. Reproductive: The uterus is surgically absent. No large adnexal mass. Other: The visualized soft tissues and abdominal wall are unremarkable. Musculoskeletal: No suspicious osseous lesions. There are mild multilevel degenerative changes in the visualized spine. Note is made of left proximal femur intramedullary nail. IMPRESSION: 1. No acute traumatic injury to the abdomen or pelvis. 2. No nephroureterolithiasis or obstructive uropathy. 3. Multiple other nonacute observations (such as small right pleural effusion, multiple hepatic cysts, left kidney lower pole subcentimeter sized nonobstructing calculi, moderate-sized hiatal hernia, etc.), As described above. Aortic Atherosclerosis (ICD10-I70.0). Electronically Signed   By: Ree Molt M.D.   On: 11/01/2023 14:56   CT L-SPINE NO CHARGE Result Date: 11/01/2023 CLINICAL DATA:  Bruising.  Abdominal pain.  Multiple falls. EXAM: CT LUMBAR SPINE WITHOUT CONTRAST TECHNIQUE: Multidetector CT imaging of the lumbar spine was performed without intravenous contrast administration. Multiplanar CT image reconstructions were also generated. RADIATION DOSE REDUCTION: This exam was performed according to the departmental dose-optimization program which includes automated exposure control, adjustment of the mA and/or kV according to patient size and/or use of iterative reconstruction technique. COMPARISON:  None Available. FINDINGS: Segmentation: 5 non rib-bearing lumbar vertebral bodies. Alignment: No substantial sagittal subluxation. Vertebrae: Vertebral body heights are maintained. No evidence of acute fracture. Osteopenia. Paraspinal and other soft tissues: Please see concurrent CT of the abdomen/pelvis for intra-abdominal intrapelvic evaluation. Unremarkable posterior paraspinal soft tissues. Disc levels: Broad disc bulge with ligamentum flavum thickening at L4-L5 with  potentially moderate canal stenosis. IMPRESSION: 1. No evidence of acute fracture or traumatic malalignment. 2. Potentially moderate canal stenosis at L4-L5. MRI could better assess if clinically warranted. 3. Osteopenia. 4.  Aortic Atherosclerosis (ICD10-I70.0). Electronically Signed   By: Gilmore GORMAN Molt M.D.   On: 11/01/2023 14:47    Procedures Procedures    Medications Ordered in ED Medications - No data to display  ED Course/ Medical Decision Making/ A&P Clinical Course as of 11/04/23 1452  Fri Nov 01, 2023  1601 Facility has responded to phone call stating that they are comfortable taking care of the toileting, helping with medications and pain control, they will need to work on moving her to a skilled nursing facility with her progressive fidgetiness which likely is causing some more of the falls. [BM]    Clinical Course User Index [BM] Cleotilde Rogue, MD                                 Medical Decision Making Amount and/or Complexity of Data Reviewed Radiology: ordered.  Risk Prescription drug management.   Back pain with difficulty ambulating and bulging L4-L5 disc.  MRI shows mild spinal canal and bilateral lateral recess stenosis.  She will be discharged home and will follow-up with her primary care doctor.  She is given  some steroids and pain medicine        Final Clinical Impression(s) / ED Diagnoses Final diagnoses:  None    Rx / DC Orders ED Discharge Orders     None         Suzette Pac, MD 11/04/23 1454

## 2023-11-01 NOTE — ED Notes (Signed)
 Attempted to call facility for baseline on patient. Facility did not answer.

## 2023-11-01 NOTE — ED Notes (Signed)
 Report given and accepted by nurse at [facility].

## 2023-11-01 NOTE — ED Notes (Signed)
 LG updated

## 2023-11-01 NOTE — ED Provider Notes (Signed)
 Pt is in no distress, VS normal MRI confirms no fracture / spinal cord issues Labs reassureing, VS reassuring Pt will have some pain meds for home Can f/u outpatient and if needed facility will be able to work on moving to higher level of care -they feel comfortable assisting with feeding, bathing and giving medications.  No indication for admission to the hospital.   Cleotilde Rogue, MD 11/01/23 [phone]

## 2023-11-01 NOTE — ED Notes (Signed)
 Attempted to call report. No answer.

## 2023-11-01 NOTE — Discharge Instructions (Signed)
 Thankfully, there is no findings of any spinal cord injuries or other bony abnormalities other than arthritis in your back.  You will need to follow-up with your family doctor, you will also need to have the staff and social worker at your nursing facility help move you to a more full skilled nursing facility.  See your doctor within 48 hours for recheck  Please assist Leslie Mcknight with taking medications, bathing and walking.  ER for worsening symptoms

## 2023-11-01 NOTE — ED Triage Notes (Signed)
 Pt BIB [facility] for c/o right flank pain   Pt has fallen multiple time and has bruising in different stages of healing to various places on her body from the falls  Pt has dementia but does express lower abdominal pain

## 2023-11-01 NOTE — ED Notes (Signed)
 2nd unsuccessful attempt at reaching facility

## 2023-11-01 NOTE — ED Notes (Signed)
 Pt on [facility] list

## 2023-11-01 NOTE — ED Notes (Signed)
 Successful conversation with facility about the appropriateness of pt returning. Return to facility should be appropriate pending no serious injury. Facility stated they are able to accommodate brief changing in bed with no need for pt to ambulate. Facility can also accommodate feeding if pt is unable to ambulate. Facility can manage pain control for pt with PO medications. MD updated.

## 2023-11-02 NOTE — ED Notes (Signed)
 Breakfast tray delivered to patient

## 2023-11-02 NOTE — ED Notes (Signed)
 Pt cleaned and re-dressed. Bed sheets changed.

## 2023-11-02 NOTE — ED Notes (Signed)
 Pt moved to private area for brief change. Pericare done, new sheets, brief and bed pad placed.

## 2023-11-05 ENCOUNTER — Other Ambulatory Visit: Payer: Self-pay

## 2024-05-15 NOTE — ED Provider Notes (Signed)
 [facility]  Emergency Department Provider Note     History   Chief Complaint Head injury fall   HPI  Leslie Mcknight is a 89 y.o. female supposedly slid down a wheelchair with injury to the back of the head.  No loss of consciousness.  Patient denies any pain or discomfort.  She is alert awake and does not have any pain or discomfort when I ask her.    Past Medical History[1]  Past Surgical History[2]  Prior to Admission medications  Not on File    Allergies Patient has no known allergies.   Short Social History[3]  Review of Systems  HENT:         Closed head injury  All other systems reviewed and are negative.   As in HPI, all systems reviewed and otherwise negative.  Physical Exam    Vitals:   05/15/24 1907  BP: 140/73  Pulse: 71  Resp: 16  Temp: 36.8 C (98.2 F)  TempSrc: Oral  SpO2: 96%     Physical Exam  Constitutional: Patient appears well-developed and well nourished. Non toxic in appearance. HEENT: Unremarkable. Head: Atraumatic.  Eyes: Normal ocular movements. Neck: Supple with normal range of motion.  Pulmonary/Chest: Effort normal. No respiratory distress. Abdominal: Soft and non tender abdomen. Musculoskeletal: Extremities atraumatic. Neurological: Alert with no focal neurological deficit. Ambulatory with a steady gait. Skin: Warm and dry.  Nursing note and vital signs reviewed.   ED Course        Procedures  No orders of the defined types were placed in this encounter.   ED Results No results found for any visits on 05/15/24.  Radiology No results found.   Medical Decision Making   I have reviewed the vital signs and the nursing notes. Labs and radiology results that were available during my care of the patient were independently reviewed by me and considered in my medical decision making.    Comfortable appearing 89 year old female with denial of any pain of any sort.  Denies any headache no  vomiting no injuries to her face no injuries to the arms or shoulder.  No pain in her ribs chest or back no abdominal pain no pain in the pelvis hips or legs.  Appears very comfortable.  On examination full range of motion passively and actively of the bilateral upper and lower extremities  Medical Decision Making    Differential Diagnosis: Closed head injury, scalp contusion, subdural hematoma    ED Clinical Impression   Final diagnoses:  None   Close head injury  Procedures      This record has been created using Animal nutritionist. Chart creation errors have been sought, but may not always have been located. Such creation errors do not reflect on the standard of medical care.       [1] No past medical history on file. [2] No past surgical history on file. [3]    Maree Jonelle Lash, MD 05/15/24 2003
